# Patient Record
Sex: Female | Born: 1960 | Race: White | Hispanic: No | Marital: Married | Smoking: Never smoker
Health system: Southern US, Community
[De-identification: ages and names within clinical notes are randomized; demographics above are authoritative.]

## PROBLEM LIST (undated history)

## (undated) DIAGNOSIS — F418 Other specified anxiety disorders: Secondary | ICD-10-CM

## (undated) DIAGNOSIS — K219 Gastro-esophageal reflux disease without esophagitis: Secondary | ICD-10-CM

## (undated) DIAGNOSIS — G809 Cerebral palsy, unspecified: Secondary | ICD-10-CM

## (undated) DIAGNOSIS — Z923 Personal history of irradiation: Secondary | ICD-10-CM

## (undated) DIAGNOSIS — R569 Unspecified convulsions: Secondary | ICD-10-CM

## (undated) DIAGNOSIS — E669 Obesity, unspecified: Secondary | ICD-10-CM

## (undated) DIAGNOSIS — G40209 Localization-related (focal) (partial) symptomatic epilepsy and epileptic syndromes with complex partial seizures, not intractable, without status epilepticus: Secondary | ICD-10-CM

## (undated) DIAGNOSIS — J45909 Unspecified asthma, uncomplicated: Secondary | ICD-10-CM

## (undated) DIAGNOSIS — C50919 Malignant neoplasm of unspecified site of unspecified female breast: Secondary | ICD-10-CM

## (undated) DIAGNOSIS — G4730 Sleep apnea, unspecified: Secondary | ICD-10-CM

## (undated) DIAGNOSIS — G43009 Migraine without aura, not intractable, without status migrainosus: Secondary | ICD-10-CM

## (undated) DIAGNOSIS — Z9221 Personal history of antineoplastic chemotherapy: Secondary | ICD-10-CM

## (undated) DIAGNOSIS — M1990 Unspecified osteoarthritis, unspecified site: Secondary | ICD-10-CM

## (undated) HISTORY — DX: Malignant neoplasm of unspecified site of unspecified female breast: C50.919

## (undated) HISTORY — DX: Other specified anxiety disorders: F41.8

## (undated) HISTORY — DX: Obesity, unspecified: E66.9

## (undated) HISTORY — DX: Unspecified asthma, uncomplicated: J45.909

## (undated) HISTORY — PX: HERNIA REPAIR: SHX51

## (undated) HISTORY — PX: FOOT CAPSULE RELEASE W/ PERCUTANEOUS HEEL CORD LENGTHENING, TIBIAL TENDON TRANSFER: SHX1658

## (undated) HISTORY — PX: OTHER SURGICAL HISTORY: SHX169

## (undated) HISTORY — DX: Migraine without aura, not intractable, without status migrainosus: G43.009

## (undated) HISTORY — PX: OOPHORECTOMY: SHX86

## (undated) HISTORY — DX: Sleep apnea, unspecified: G47.30

## (undated) HISTORY — DX: Unspecified osteoarthritis, unspecified site: M19.90

## (undated) HISTORY — DX: Gastro-esophageal reflux disease without esophagitis: K21.9

## (undated) HISTORY — DX: Cerebral palsy, unspecified: G80.9

## (undated) HISTORY — DX: Localization-related (focal) (partial) symptomatic epilepsy and epileptic syndromes with complex partial seizures, not intractable, without status epilepticus: G40.209

---

## 1996-11-20 HISTORY — PX: BREAST LUMPECTOMY: SHX2

## 1997-12-24 ENCOUNTER — Ambulatory Visit (HOSPITAL_COMMUNITY): Admission: RE | Admit: 1997-12-24 | Discharge: 1997-12-24 | Payer: Self-pay

## 1998-07-08 ENCOUNTER — Encounter: Admission: RE | Admit: 1998-07-08 | Discharge: 1998-10-06 | Payer: Self-pay | Admitting: Family Medicine

## 1998-11-29 ENCOUNTER — Other Ambulatory Visit: Admission: RE | Admit: 1998-11-29 | Discharge: 1998-11-29 | Payer: Self-pay | Admitting: Obstetrics and Gynecology

## 1999-01-07 ENCOUNTER — Ambulatory Visit (HOSPITAL_BASED_OUTPATIENT_CLINIC_OR_DEPARTMENT_OTHER): Admission: RE | Admit: 1999-01-07 | Discharge: 1999-01-07 | Payer: Self-pay | Admitting: General Surgery

## 1999-01-31 ENCOUNTER — Ambulatory Visit (HOSPITAL_COMMUNITY): Admission: RE | Admit: 1999-01-31 | Discharge: 1999-01-31 | Payer: Self-pay | Admitting: Oncology

## 1999-02-01 ENCOUNTER — Ambulatory Visit (HOSPITAL_COMMUNITY): Admission: RE | Admit: 1999-02-01 | Discharge: 1999-02-02 | Payer: Self-pay | Admitting: General Surgery

## 1999-02-01 ENCOUNTER — Encounter: Payer: Self-pay | Admitting: General Surgery

## 1999-02-11 ENCOUNTER — Ambulatory Visit (HOSPITAL_COMMUNITY): Admission: RE | Admit: 1999-02-11 | Discharge: 1999-02-11 | Payer: Self-pay | Admitting: Oncology

## 1999-02-11 ENCOUNTER — Encounter: Payer: Self-pay | Admitting: Oncology

## 1999-03-01 ENCOUNTER — Encounter: Admission: RE | Admit: 1999-03-01 | Discharge: 1999-05-30 | Payer: Self-pay | Admitting: *Deleted

## 1999-05-31 ENCOUNTER — Encounter: Admission: RE | Admit: 1999-05-31 | Discharge: 1999-08-29 | Payer: Self-pay | Admitting: *Deleted

## 1999-09-23 ENCOUNTER — Emergency Department (HOSPITAL_COMMUNITY): Admission: EM | Admit: 1999-09-23 | Discharge: 1999-09-23 | Payer: Self-pay | Admitting: Emergency Medicine

## 1999-09-23 ENCOUNTER — Encounter: Payer: Self-pay | Admitting: Emergency Medicine

## 2000-06-01 ENCOUNTER — Other Ambulatory Visit: Admission: RE | Admit: 2000-06-01 | Discharge: 2000-06-01 | Payer: Self-pay | Admitting: Obstetrics and Gynecology

## 2000-07-18 ENCOUNTER — Encounter: Payer: Self-pay | Admitting: Emergency Medicine

## 2000-07-18 ENCOUNTER — Emergency Department (HOSPITAL_COMMUNITY): Admission: EM | Admit: 2000-07-18 | Discharge: 2000-07-18 | Payer: Self-pay | Admitting: Emergency Medicine

## 2000-08-28 ENCOUNTER — Ambulatory Visit (HOSPITAL_BASED_OUTPATIENT_CLINIC_OR_DEPARTMENT_OTHER): Admission: RE | Admit: 2000-08-28 | Discharge: 2000-08-28 | Payer: Self-pay | Admitting: General Surgery

## 2001-07-03 ENCOUNTER — Other Ambulatory Visit: Admission: RE | Admit: 2001-07-03 | Discharge: 2001-07-03 | Payer: Self-pay | Admitting: Obstetrics and Gynecology

## 2003-03-12 ENCOUNTER — Encounter: Admission: RE | Admit: 2003-03-12 | Discharge: 2003-03-12 | Payer: Self-pay | Admitting: Oncology

## 2003-03-12 ENCOUNTER — Encounter: Payer: Self-pay | Admitting: Oncology

## 2003-11-04 ENCOUNTER — Other Ambulatory Visit: Admission: RE | Admit: 2003-11-04 | Discharge: 2003-11-04 | Payer: Self-pay | Admitting: Obstetrics and Gynecology

## 2003-11-30 ENCOUNTER — Encounter: Admission: RE | Admit: 2003-11-30 | Discharge: 2004-02-28 | Payer: Self-pay | Admitting: Obstetrics and Gynecology

## 2006-11-20 HISTORY — PX: BREAST LUMPECTOMY: SHX2

## 2007-01-18 ENCOUNTER — Ambulatory Visit (HOSPITAL_COMMUNITY): Admission: RE | Admit: 2007-01-18 | Discharge: 2007-01-18 | Payer: Self-pay | Admitting: Family Medicine

## 2007-01-28 ENCOUNTER — Encounter (INDEPENDENT_AMBULATORY_CARE_PROVIDER_SITE_OTHER): Payer: Self-pay | Admitting: Specialist

## 2007-01-28 ENCOUNTER — Encounter: Admission: RE | Admit: 2007-01-28 | Discharge: 2007-01-28 | Payer: Self-pay | Admitting: Family Medicine

## 2007-01-29 ENCOUNTER — Encounter: Admission: RE | Admit: 2007-01-29 | Discharge: 2007-04-29 | Payer: Self-pay | Admitting: Family Medicine

## 2007-02-01 ENCOUNTER — Encounter: Admission: RE | Admit: 2007-02-01 | Discharge: 2007-02-01 | Payer: Self-pay | Admitting: Family Medicine

## 2007-02-19 ENCOUNTER — Encounter: Admission: RE | Admit: 2007-02-19 | Discharge: 2007-02-19 | Payer: Self-pay | Admitting: General Surgery

## 2007-02-21 ENCOUNTER — Ambulatory Visit (HOSPITAL_BASED_OUTPATIENT_CLINIC_OR_DEPARTMENT_OTHER): Admission: RE | Admit: 2007-02-21 | Discharge: 2007-02-21 | Payer: Self-pay | Admitting: General Surgery

## 2007-02-21 ENCOUNTER — Encounter (INDEPENDENT_AMBULATORY_CARE_PROVIDER_SITE_OTHER): Payer: Self-pay | Admitting: Specialist

## 2007-02-21 ENCOUNTER — Encounter: Admission: RE | Admit: 2007-02-21 | Discharge: 2007-02-21 | Payer: Self-pay | Admitting: General Surgery

## 2007-03-05 ENCOUNTER — Ambulatory Visit: Payer: Self-pay | Admitting: Oncology

## 2007-03-21 LAB — COMPREHENSIVE METABOLIC PANEL
Albumin: 4.2 g/dL (ref 3.5–5.2)
Alkaline Phosphatase: 53 U/L (ref 39–117)
Calcium: 8.7 mg/dL (ref 8.4–10.5)
Chloride: 108 mEq/L (ref 96–112)
Glucose, Bld: 105 mg/dL — ABNORMAL HIGH (ref 70–99)
Potassium: 3.6 mEq/L (ref 3.5–5.3)
Sodium: 139 mEq/L (ref 135–145)
Total Protein: 6.7 g/dL (ref 6.0–8.3)

## 2007-03-21 LAB — CBC WITH DIFFERENTIAL/PLATELET
Basophils Absolute: 0 10*3/uL (ref 0.0–0.1)
Eosinophils Absolute: 0 10*3/uL (ref 0.0–0.5)
HGB: 13.3 g/dL (ref 11.6–15.9)
MCV: 88.9 fL (ref 81.0–101.0)
MONO#: 0.5 10*3/uL (ref 0.1–0.9)
MONO%: 7 % (ref 0.0–13.0)
NEUT#: 4.4 10*3/uL (ref 1.5–6.5)
RBC: 4.23 10*6/uL (ref 3.70–5.32)
RDW: 12.9 % (ref 11.3–14.5)
WBC: 6.8 10*3/uL (ref 3.9–10.0)
lymph#: 1.9 10*3/uL (ref 0.9–3.3)

## 2007-03-21 LAB — FOLLICLE STIMULATING HORMONE: FSH: 23.4 m[IU]/mL

## 2007-03-25 ENCOUNTER — Ambulatory Visit: Admission: RE | Admit: 2007-03-25 | Discharge: 2007-06-12 | Payer: Self-pay | Admitting: *Deleted

## 2007-04-01 ENCOUNTER — Encounter: Admission: RE | Admit: 2007-04-01 | Discharge: 2007-04-01 | Payer: Self-pay | Admitting: Oncology

## 2007-04-25 ENCOUNTER — Encounter (INDEPENDENT_AMBULATORY_CARE_PROVIDER_SITE_OTHER): Payer: Self-pay | Admitting: Obstetrics and Gynecology

## 2007-04-25 ENCOUNTER — Ambulatory Visit (HOSPITAL_COMMUNITY): Admission: RE | Admit: 2007-04-25 | Discharge: 2007-04-25 | Payer: Self-pay | Admitting: Obstetrics and Gynecology

## 2007-05-23 ENCOUNTER — Encounter: Admission: RE | Admit: 2007-05-23 | Discharge: 2007-05-23 | Payer: Self-pay | Admitting: Oncology

## 2007-05-30 ENCOUNTER — Ambulatory Visit: Payer: Self-pay | Admitting: Oncology

## 2007-06-03 LAB — CBC WITH DIFFERENTIAL/PLATELET
Basophils Absolute: 0 10*3/uL (ref 0.0–0.1)
EOS%: 0 % (ref 0.0–7.0)
HCT: 37.3 % (ref 34.8–46.6)
HGB: 13.4 g/dL (ref 11.6–15.9)
MCH: 32.2 pg (ref 26.0–34.0)
MCHC: 35.8 g/dL (ref 32.0–36.0)
MCV: 89.9 fL (ref 81.0–101.0)
MONO%: 11.2 % (ref 0.0–13.0)
NEUT%: 66.9 % (ref 39.6–76.8)
RDW: 13.4 % (ref 11.3–14.5)

## 2007-06-06 LAB — COMPREHENSIVE METABOLIC PANEL
ALT: 17 U/L (ref 0–35)
AST: 17 U/L (ref 0–37)
CO2: 25 mEq/L (ref 19–32)
Chloride: 103 mEq/L (ref 96–112)
Sodium: 139 mEq/L (ref 135–145)
Total Bilirubin: 0.3 mg/dL (ref 0.3–1.2)
Total Protein: 7.2 g/dL (ref 6.0–8.3)

## 2007-06-06 LAB — CANCER ANTIGEN 27.29: CA 27.29: 18 U/mL (ref 0–39)

## 2007-06-13 LAB — ESTRADIOL, ULTRA SENS: Estradiol, Ultra Sensitive: 11 pg/mL

## 2007-08-02 ENCOUNTER — Ambulatory Visit: Payer: Self-pay | Admitting: Oncology

## 2007-08-29 LAB — CBC WITH DIFFERENTIAL/PLATELET
Basophils Absolute: 0 10*3/uL (ref 0.0–0.1)
Eosinophils Absolute: 0 10*3/uL (ref 0.0–0.5)
HCT: 35.6 % (ref 34.8–46.6)
HGB: 12.7 g/dL (ref 11.6–15.9)
MCV: 89.5 fL (ref 81.0–101.0)
MONO%: 10.3 % (ref 0.0–13.0)
NEUT#: 3.2 10*3/uL (ref 1.5–6.5)
RDW: 13.1 % (ref 11.3–14.5)

## 2007-08-29 LAB — COMPREHENSIVE METABOLIC PANEL
Albumin: 4.1 g/dL (ref 3.5–5.2)
Alkaline Phosphatase: 62 U/L (ref 39–117)
BUN: 6 mg/dL (ref 6–23)
Calcium: 9.2 mg/dL (ref 8.4–10.5)
Chloride: 104 mEq/L (ref 96–112)
Glucose, Bld: 105 mg/dL — ABNORMAL HIGH (ref 70–99)
Potassium: 3.5 mEq/L (ref 3.5–5.3)

## 2007-10-02 ENCOUNTER — Ambulatory Visit: Payer: Self-pay | Admitting: Oncology

## 2007-12-04 ENCOUNTER — Encounter: Admission: RE | Admit: 2007-12-04 | Discharge: 2008-01-01 | Payer: Self-pay | Admitting: Family Medicine

## 2008-01-14 ENCOUNTER — Encounter: Admission: RE | Admit: 2008-01-14 | Discharge: 2008-02-25 | Payer: Self-pay | Admitting: Family Medicine

## 2008-02-26 ENCOUNTER — Encounter: Admission: RE | Admit: 2008-02-26 | Discharge: 2008-02-26 | Payer: Self-pay | Admitting: Family Medicine

## 2008-04-16 ENCOUNTER — Ambulatory Visit: Payer: Self-pay | Admitting: Oncology

## 2008-04-20 LAB — CBC WITH DIFFERENTIAL/PLATELET
BASO%: 0.4 % (ref 0.0–2.0)
Basophils Absolute: 0 10*3/uL (ref 0.0–0.1)
HCT: 39.1 % (ref 34.8–46.6)
HGB: 13.5 g/dL (ref 11.6–15.9)
MCH: 31.4 pg (ref 26.0–34.0)
MCHC: 34.6 g/dL (ref 32.0–36.0)
MCV: 90.9 fL (ref 81.0–101.0)
MONO%: 8.2 % (ref 0.0–13.0)
NEUT%: 66.6 % (ref 39.6–76.8)
Platelets: 260 10*3/uL (ref 145–400)
RBC: 4.3 10*6/uL (ref 3.70–5.32)
RDW: 13.7 % (ref 11.3–14.5)

## 2008-04-20 LAB — COMPREHENSIVE METABOLIC PANEL
AST: 22 U/L (ref 0–37)
CO2: 24 mEq/L (ref 19–32)
Glucose, Bld: 110 mg/dL — ABNORMAL HIGH (ref 70–99)
Potassium: 3.7 mEq/L (ref 3.5–5.3)

## 2008-04-24 ENCOUNTER — Encounter: Admission: RE | Admit: 2008-04-24 | Discharge: 2008-04-24 | Payer: Self-pay | Admitting: Oncology

## 2008-10-16 ENCOUNTER — Ambulatory Visit: Payer: Self-pay | Admitting: Oncology

## 2009-02-26 ENCOUNTER — Ambulatory Visit: Payer: Self-pay | Admitting: Oncology

## 2009-03-02 LAB — COMPREHENSIVE METABOLIC PANEL
ALT: 18 U/L (ref 0–35)
AST: 23 U/L (ref 0–37)
Alkaline Phosphatase: 62 U/L (ref 39–117)
Chloride: 105 mEq/L (ref 96–112)
Glucose, Bld: 95 mg/dL (ref 70–99)
Potassium: 3.6 mEq/L (ref 3.5–5.3)
Sodium: 139 mEq/L (ref 135–145)
Total Protein: 6.6 g/dL (ref 6.0–8.3)

## 2009-03-02 LAB — CBC WITH DIFFERENTIAL/PLATELET
HGB: 12.8 g/dL (ref 11.6–15.9)
LYMPH%: 35.7 % (ref 14.0–49.7)
MCH: 31.3 pg (ref 25.1–34.0)
MONO%: 10.1 % (ref 0.0–14.0)
NEUT%: 53.9 % (ref 38.4–76.8)

## 2009-04-26 ENCOUNTER — Encounter: Admission: RE | Admit: 2009-04-26 | Discharge: 2009-04-26 | Payer: Self-pay | Admitting: Oncology

## 2009-05-25 ENCOUNTER — Encounter: Admission: RE | Admit: 2009-05-25 | Discharge: 2009-05-25 | Payer: Self-pay | Admitting: Oncology

## 2009-08-27 ENCOUNTER — Ambulatory Visit: Payer: Self-pay | Admitting: Oncology

## 2009-08-31 LAB — CBC WITH DIFFERENTIAL/PLATELET
BASO%: 0.3 % (ref 0.0–2.0)
Eosinophils Absolute: 0 10*3/uL (ref 0.0–0.5)
HCT: 37.8 % (ref 34.8–46.6)
LYMPH%: 33.6 % (ref 14.0–49.7)
MCV: 91.6 fL (ref 79.5–101.0)
MONO#: 0.4 10*3/uL (ref 0.1–0.9)
NEUT#: 3.3 10*3/uL (ref 1.5–6.5)
NEUT%: 58.6 % (ref 38.4–76.8)
Platelets: 281 10*3/uL (ref 145–400)
RBC: 4.13 10*6/uL (ref 3.70–5.45)

## 2009-08-31 LAB — COMPREHENSIVE METABOLIC PANEL
ALT: 22 U/L (ref 0–35)
AST: 22 U/L (ref 0–37)
BUN: 6 mg/dL (ref 6–23)
CO2: 29 mEq/L (ref 19–32)
Chloride: 105 mEq/L (ref 96–112)
Total Protein: 6.7 g/dL (ref 6.0–8.3)

## 2010-03-01 ENCOUNTER — Ambulatory Visit: Payer: Self-pay | Admitting: Oncology

## 2010-03-03 LAB — CBC WITH DIFFERENTIAL/PLATELET
BASO%: 0.2 % (ref 0.0–2.0)
EOS%: 0 % (ref 0.0–7.0)
Eosinophils Absolute: 0 10*3/uL (ref 0.0–0.5)
HCT: 39.7 % (ref 34.8–46.6)
HGB: 13.3 g/dL (ref 11.6–15.9)
MCH: 31.1 pg (ref 25.1–34.0)
MCV: 92.8 fL (ref 79.5–101.0)
MONO#: 0.6 10*3/uL (ref 0.1–0.9)
NEUT#: 3.3 10*3/uL (ref 1.5–6.5)
Platelets: 279 10*3/uL (ref 145–400)
RBC: 4.27 10*6/uL (ref 3.70–5.45)
RDW: 13.7 % (ref 11.2–14.5)
WBC: 5.7 10*3/uL (ref 3.9–10.3)
lymph#: 1.8 10*3/uL (ref 0.9–3.3)

## 2010-03-03 LAB — COMPREHENSIVE METABOLIC PANEL
AST: 22 U/L (ref 0–37)
Albumin: 3.9 g/dL (ref 3.5–5.2)
Alkaline Phosphatase: 56 U/L (ref 39–117)
BUN: 11 mg/dL (ref 6–23)
Creatinine, Ser: 0.65 mg/dL (ref 0.40–1.20)
Potassium: 3.8 mEq/L (ref 3.5–5.3)
Total Bilirubin: 0.5 mg/dL (ref 0.3–1.2)
Total Protein: 6.8 g/dL (ref 6.0–8.3)

## 2010-06-27 ENCOUNTER — Encounter: Admission: RE | Admit: 2010-06-27 | Discharge: 2010-06-27 | Payer: Self-pay | Admitting: Family Medicine

## 2010-08-26 ENCOUNTER — Ambulatory Visit: Payer: Self-pay | Admitting: Oncology

## 2010-08-30 LAB — CBC WITH DIFFERENTIAL/PLATELET
BASO%: 0.3 % (ref 0.0–2.0)
Eosinophils Absolute: 0 10*3/uL (ref 0.0–0.5)
HCT: 38.8 % (ref 34.8–46.6)
HGB: 13.4 g/dL (ref 11.6–15.9)
Platelets: 264 10*3/uL (ref 145–400)
RDW: 13.3 % (ref 11.2–14.5)

## 2010-08-30 LAB — COMPREHENSIVE METABOLIC PANEL
AST: 17 U/L (ref 0–37)
Alkaline Phosphatase: 62 U/L (ref 39–117)
BUN: 13 mg/dL (ref 6–23)
Creatinine, Ser: 0.63 mg/dL (ref 0.40–1.20)
Total Protein: 6.7 g/dL (ref 6.0–8.3)

## 2010-12-10 ENCOUNTER — Other Ambulatory Visit: Payer: Self-pay | Admitting: Oncology

## 2010-12-10 DIAGNOSIS — Z9889 Other specified postprocedural states: Secondary | ICD-10-CM

## 2010-12-10 DIAGNOSIS — Z90710 Acquired absence of both cervix and uterus: Secondary | ICD-10-CM

## 2010-12-10 DIAGNOSIS — Z853 Personal history of malignant neoplasm of breast: Secondary | ICD-10-CM

## 2011-03-08 ENCOUNTER — Encounter (HOSPITAL_BASED_OUTPATIENT_CLINIC_OR_DEPARTMENT_OTHER): Payer: BC Managed Care – PPO | Admitting: Oncology

## 2011-03-08 ENCOUNTER — Other Ambulatory Visit: Payer: Self-pay | Admitting: Oncology

## 2011-03-08 DIAGNOSIS — C50419 Malignant neoplasm of upper-outer quadrant of unspecified female breast: Secondary | ICD-10-CM

## 2011-03-08 DIAGNOSIS — F411 Generalized anxiety disorder: Secondary | ICD-10-CM

## 2011-03-08 DIAGNOSIS — F329 Major depressive disorder, single episode, unspecified: Secondary | ICD-10-CM

## 2011-03-08 LAB — CBC WITH DIFFERENTIAL/PLATELET
EOS%: 0 % (ref 0.0–7.0)
HCT: 37.7 % (ref 34.8–46.6)
MCHC: 34.4 g/dL (ref 31.5–36.0)
MCV: 91.4 fL (ref 79.5–101.0)
NEUT#: 3.8 10*3/uL (ref 1.5–6.5)
NEUT%: 59.7 % (ref 38.4–76.8)
RBC: 4.12 10*6/uL (ref 3.70–5.45)
RDW: 13.4 % (ref 11.2–14.5)
WBC: 6.4 10*3/uL (ref 3.9–10.3)

## 2011-03-08 LAB — COMPREHENSIVE METABOLIC PANEL
AST: 24 U/L (ref 0–37)
Albumin: 3.7 g/dL (ref 3.5–5.2)
BUN: 7 mg/dL (ref 6–23)
Calcium: 9 mg/dL (ref 8.4–10.5)
Creatinine, Ser: 0.5 mg/dL (ref 0.40–1.20)
Potassium: 3.7 mEq/L (ref 3.5–5.3)
Sodium: 139 mEq/L (ref 135–145)
Total Bilirubin: 0.7 mg/dL (ref 0.3–1.2)
Total Protein: 6.7 g/dL (ref 6.0–8.3)

## 2011-04-04 NOTE — H&P (Signed)
NAMEVINCENT, EHRLER               ACCOUNT NO.:  [id number]   MEDICAL RECORD NO.:  [id number]          PATIENT TYPE:  AMB   LOCATION:  SDC                           [facility]  [facility]   PHYSICIAN:  Duke Salvia. Marcelle Overlie, M.D.DATE OF BIRTH:  [birth year]   DATE OF ADMISSION:  04/25/2007  DATE OF DISCHARGE:                              HISTORY & PHYSICAL   CHIEF COMPLAINT:  Breast cancer.   HISTORY OF PRESENT ILLNESS:  A 50 year old, gravida 0, para 0.  I saw  her initially in 2004 at which point she was postop treatment for early  stage breast cancer and was on tamoxifen at that point.   Recently Dr. Darnelle Catalan is treating her for recurrent breast cancer and  had recommended BSO to remove her estrogen source to improve her  treatment outcome.  She presents now for laparoscopic BSO.  This  procedure including risk of bleeding, infection, adjacent organ injury,  the possible need for open additional surgery all were reviewed with her  which she understands and accepts.   CURRENT MEDICATIONS:  Tegretol, Tranxene, Singulair, Nexium,  multivitamins, Mobic p.r.n., Maxalt p.r.n., Desyrel p.r.n., and Allegra  p.r.n.   ALLERGIES:  SULFA DRUGS.   REVIEW OF SYSTEMS:  Significant for breast cancer.  In her younger life,  she was treated for a seizure disorder.   PHYSICAL EXAMINATION:  VITAL SIGNS:  Temperature 98.2, blood pressure  120/70.  HEENT:  Unremarkable.  NECK:  Supple without masses.  LUNGS:  Clear.  CARDIOVASCULAR:  Regular rate and rhythm without murmurs, rubs or  gallops.  BREASTS:  No masses.  ABDOMEN:  Soft, flat and nontender.  PELVIC:  Normal external genitalia and vagina.  Vagina, cervix clear.  Uterus normal position and size.  Adnexa unremarkable.  EXTREMITIES/NEUROLOGIC:  Unremarkable.   IMPRESSION:  Recurrent ER positive breast cancer.  Dr. Darnelle Catalan has  recommended bilateral salpingo-oophorectomy to remove her estrogen  source.   PLAN:  Laparoscopic bilateral  salpingo-oophorectomy.  Procedure and  risks reviewed with as above.     Richard M. Marcelle Overlie, M.D.  Electronically Signed    RMH/MEDQ  D:  04/23/2007  T:  04/23/2007  Job:  [id number]

## 2011-04-04 NOTE — Op Note (Signed)
[name], Debbie Peck               ACCOUNT NO.:  [id number]   MEDICAL RECORD NO.:  [id number]          PATIENT TYPE:  AMB   LOCATION:  SDC                           [facility]   PHYSICIAN:  Duke Salvia. Marcelle Overlie, M.D.DATE OF BIRTH:  22-Jan-1961   DATE OF PROCEDURE:  04/25/2007  DATE OF DISCHARGE:                               OPERATIVE REPORT   PREOPERATIVE DIAGNOSIS:  Recurrent breast cancer estrogen receptor  positive.   POSTOPERATIVE DIAGNOSIS:  Recurrent breast cancer estrogen receptor  positive.   PROCEDURE:  Laparoscopic BSO.   SURGEON:  Dr. Marcelle Overlie   ANESTHESIA:  General endotracheal.   SPECIMENS REMOVED:  Bilateral tubes and ovaries.   DESCRIPTION OF PROCEDURE:  The patient was taken to the operating room.  After adequate level of general diagnoses obtained the patient's legs  stirrups.  The abdomen, perineum and vagina prepped and usual manner for  laparoscopic procedures.  Bladder was drained.  Hulka tenaculum was  positioned.  Attention directed to the abdomen where the subumbilical  area was infiltrated with 0.5% Marcaine plain.  A small incision was  made. A Veress needle was introduced without difficulty.  Its intra-  abdominal position verified by pressure water testing.  After 2.5 liter  pneumoperitoneum was then created, laparoscopic trocar and sleeve were  then introduced without difficulty.  Three fingerbreadths above the  symphysis in midline a bladeless 11/12 trocar inserted in the midline  and a 5 mm in the left lower quadrant after negative transillumination.  The patient placed in Trendelenburg. Pelvic findings as follows.   The uterus, tubes and ovaries, cul-de-sac unremarkable.  The cecum,  upper abdomen and appendix were unremarkable.  Uterus was anteflexed.  The right tube and ovary was grasped with a atraumatic grasper placed on  traction toward the midline.  The course of the right ureter was well  below.  The right IP ligament was then  coagulated and divided with the  gyrus PK instrument along the mesosalpinx dividing and removing the  right tube and ovary with excellent hemostasis.  The exact same repeated  on the opposite side after carefully identifying the course of the left  ureter below.  Specimens were then cut in half and removed through the  lower port and sent to pathology. The Nezhat irrigator was then used to  irrigate and all fragments of ovarian and tubal tissue had been removed.  Hemostasis was excellent even with reduced pressure.  Instruments were  removed, gas allowed to escape, defects closed with 4-0 Dexon  subcuticular sutures and Dermabond.  She tolerated this well and went to  recovery room in good condition.     Richard M. Marcelle Overlie, M.D.  Electronically Signed    RMH/MEDQ  D:  04/25/2007  T:  04/25/2007  Job:  [id number]

## 2011-04-07 NOTE — Op Note (Signed)
[name], [name]               ACCOUNT NO.:  [id number]   MEDICAL RECORD NO.:  [id number]          PATIENT TYPE:  AMB   LOCATION:  DSC                          FACILITY:  [facility]   PHYSICIAN:  Anselm Pancoast. 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   POSTOPERATIVE DIAGNOSIS:  Await final pathology.   OPERATION:  Needle localized excisional biopsy and sentinel node in left  axilla.   ANESTHESIA:  General.   SURGEON:  Anselm Pancoast. Zachery Dakins, M.D.   HISTORY:  Debbie Peck is a 50 year old female who I managed about 7  years ago, I think, with a large mass in the right breast that was  carcinoma.  She had a sentinel lymph node.  She was with chemotherapy,  had a reexcision because of positive margin after the first lumpectomy  and has had radiation therapy.  She is being followed with yearly  mammograms and no change in the right, but we had an area of  microcalcification in the left breast at probably the 3 to 4 o'clock  position that was biopsied in the breast center and shows carcinoma.  The area of microcalcification is probably about the size of a nickel to  a quarter and on the mammogram, there really is no definite mass effect  so hopefully this is quite early.  When I saw her in office about 2  weeks ago, she was right after a biopsy and you could feel an area of  bruising, but there was not an actual mass palpable and we elected to go  ahead and do an excisional biopsy after needle localization and sentinel  node today.  I do not think she has seen Dr. Raymond Gurney C. Magrinat and  whether this will need to be just radiation therapy or combination chemo  radiation afterwards, we will determine after we have had the results of  all of the pathology specimens.  This morning, first, she has had a  needle localization.  She has also had injection of the radioactive dye  in the left subareolar area and then was sent over to the [facility].  The patient is quite anxious as she frequently is and was  given a little Ativan in the preop holding area.  The patient's left  side was marked and on examination now, you can see the wire but there  is really not a mass.  When I saw her in the office, what I was feeling  I think was the biopsy hematoma and not actually a breast mass.  There  are no nodes palpable in the axilla.  She was given a gram of Ancef and  positioned on the OR table.  Induction of general anesthesia with an  tube and I used the needle probe and actually marked the skin where the  hot areas were.  I then injected the subcuticular areolar area with  about 3 mL of  the Lymphazurin and then massaged the area.  I had prepped  her prior to injecting of the Lymphazurin.  The area first, I elected to  go ahead and do the axillary lymph node biopsy.  I made a little  incision right where the hot area was with the probe transversely and  then dissected down through the adipose tissue.  I picked up the  lymphatic an then followed this into an area that certainly felt and  looked to me like a lymph node and used a few little Hemoclips and also  some sutures of 4-0 Vicryl in the surrounding area.  The tissue had a  count of about 50, which was initially higher than the surrounding  adipose tissue and Dr. Luisa Hart did touch preps on this as I was doing  the lumpectomy incision.  As far as on the touch preps, when he called,  he said there was definitely blue.  What felt like a lymph node and was  definitely blue was adipose tissue and there was not really a true lymph  node that they could definitely identify.  The touch preps and  everything were definitely negative.  There was a lot of blue dye.  I  discussed with him that I would open up the incision and get more  lymphoid  tissue after I had completed a lumpectomy.  As far as on the  lumpectomy, the incision was kind of lateral since this area is  definitely more lateral than usual and I elevated the skin from the  underlying adipose and breast tissue and then flipped the wire from  under the skin so we are getting a little more central to the true  breast tissue not coming in so lateral where they have put their guide  wire in, and then went circumferential on the area predominantly in  fatty tissue and then the actual breast tissue.  I could see the  inferior aspect of the wire and the breast tissue was divided  predominantly with cautery. There were several little areas of bleeders  that were either coagulated or sutured with a 4-0 Vicryl.  As far as on  removal of the area, I have sort of lost any type of orientation and the  breast is actually medial and inferior and then the wire of course is  going out on the specimen.  On the mammography specimen, it appears that  the microcalcification was definitely in the center of this and it looks  like we have a good margin circumferentially.  The defect was closed in  2 layers using 4-0 Vicryl in the deeper layer.  No drains were placed  and then used 4-0 Vicryl in the middle more superficial and then closed  the skin with staples.  I opened up the axilla where I closed it in the  fatty tissue but not actually closed the skin and then using the needle  probe found 3 lymph nodes all in the immediate area that were definitely  warm, some of them were counts of 80 or 90, others were 40 to 50 that  were not grossly abnormal but they are obviously lymph nodes.  One of  them was faintly blue but not as blue as what I would consider a hot and  definitely blue node.  Dr. Luisa Hart looked at these and said that these  are definitely lymph nodes, does not see anything that looks suspicious for metastatic disease and it looks like there is probably  3 lymph nodes  according  to his count.  We closed the defect with 4-0 Vicryl.  I had  used some 4-0 Vicryl sutures and also a few clips and removing these and  then closed the wound, the deeper layer, with 4-0 Vicryl and then a  superficial 4-0 Vicryl and then skin staples.  The patient tolerated the  procedure nicely, will be released after a short stay in the recovery  room.   I will see her back in the office next Thursday and hopefully all of the  lymph  nodes final report will be negative and that they will not have  extensive DCIS in the surrounding breast tissue.           ______________________________  Anselm Pancoast. Zachery Dakins, M.D.     WJW/MEDQ  D:  02/21/2007  T:  02/21/2007  Job:  [id number]   cc:   Valentino Hue. Magrinat, M.D.

## 2011-06-29 ENCOUNTER — Ambulatory Visit
Admission: RE | Admit: 2011-06-29 | Discharge: 2011-06-29 | Disposition: A | Discharge status: Home / Self Care | Payer: BC Managed Care – PPO | Source: Ambulatory Visit | Attending: Oncology | Admitting: Oncology

## 2011-06-29 DIAGNOSIS — Z90710 Acquired absence of both cervix and uterus: Secondary | ICD-10-CM

## 2011-06-29 DIAGNOSIS — Z9889 Other specified postprocedural states: Secondary | ICD-10-CM

## 2011-06-29 DIAGNOSIS — Z853 Personal history of malignant neoplasm of breast: Secondary | ICD-10-CM

## 2011-09-07 LAB — COMPREHENSIVE METABOLIC PANEL
ALT: 22
AST: 26
Albumin: 3.5
Alkaline Phosphatase: 49
BUN: 6
CO2: 25
Calcium: 9
Chloride: 106
Creatinine, Ser: 0.58
GFR calc Af Amer: >60
GFR calc non Af Amer: >60
Glucose, Bld: 100 — ABNORMAL HIGH
Potassium: 3.6
Sodium: 138
Total Bilirubin: 0.3
Total Protein: 6.2

## 2011-09-07 LAB — CBC
RBC: 4.07
WBC: 7.5

## 2012-04-22 ENCOUNTER — Encounter: Payer: Self-pay | Admitting: Oncology

## 2012-04-22 NOTE — Progress Notes (Signed)
Put disability placard form in registration desk.

## 2012-12-06 ENCOUNTER — Telehealth: Payer: Self-pay | Admitting: *Deleted

## 2012-12-06 NOTE — Telephone Encounter (Signed)
Pt. Called and left message that the instructor for the [employer] class had not received written permission for [place] to participate in the class.   VO given by Dr. Darnelle Catalan.  Spoke with Baxter Hire the instructor [phone].  Note written and faxed to her [phone].  Called pt. And left message that permission had been faxed to instructor.

## 2013-07-25 ENCOUNTER — Encounter: Payer: Self-pay | Admitting: Neurology

## 2013-07-25 DIAGNOSIS — G809 Cerebral palsy, unspecified: Secondary | ICD-10-CM | POA: Insufficient documentation

## 2013-07-25 DIAGNOSIS — Z5181 Encounter for therapeutic drug level monitoring: Secondary | ICD-10-CM | POA: Insufficient documentation

## 2013-07-25 DIAGNOSIS — G43009 Migraine without aura, not intractable, without status migrainosus: Secondary | ICD-10-CM | POA: Insufficient documentation

## 2013-07-25 DIAGNOSIS — G808 Other cerebral palsy: Secondary | ICD-10-CM

## 2013-07-25 DIAGNOSIS — G40219 Localization-related (focal) (partial) symptomatic epilepsy and epileptic syndromes with complex partial seizures, intractable, without status epilepticus: Secondary | ICD-10-CM | POA: Insufficient documentation

## 2013-07-30 ENCOUNTER — Ambulatory Visit (INDEPENDENT_AMBULATORY_CARE_PROVIDER_SITE_OTHER): Payer: Self-pay | Admitting: Neurology

## 2013-07-30 ENCOUNTER — Encounter: Payer: Self-pay | Admitting: Neurology

## 2013-07-30 VITALS — BP 124/76 | HR 72 | Wt 214.0 lb

## 2013-07-30 DIAGNOSIS — G40219 Localization-related (focal) (partial) symptomatic epilepsy and epileptic syndromes with complex partial seizures, intractable, without status epilepticus: Secondary | ICD-10-CM

## 2013-07-30 DIAGNOSIS — G809 Cerebral palsy, unspecified: Secondary | ICD-10-CM

## 2013-07-30 MED ORDER — CARBAMAZEPINE 200 MG PO TABS: 400.0000 mg | ORAL_TABLET | Freq: Two times a day (BID) | ORAL | Status: DC

## 2013-07-30 MED ORDER — RIZATRIPTAN BENZOATE 10 MG PO TABS: 10.0000 mg | ORAL_TABLET | ORAL | Status: DC | PRN

## 2013-07-30 NOTE — Patient Instructions (Signed)
With the carbamazepine, start at 200 mg twice a day for 2 weeks, then take 2 tablets twice a day.

## 2013-07-30 NOTE — Progress Notes (Signed)
Reason for visit: Seizures, headache  Debbie Peck is an 52 y.o. female  History of present illness:  Debbie Peck is a 52 year old left-handed white female with a history of seizures, cerebral palsy, migraine headache, and psychiatric disease. The patient indicates that she lost her insurance about 6 months ago, and she has been off of all of her medications for least 4 months. The patient has not had any recurrent seizures. The patient was on carbamazepine taking the long-acting 400 mg tablet twice daily. The patient does have occasional migraine headaches, and she usually takes Maxalt for this. The patient has had some increase in depression, and she is not sleeping well since she has been off of all of her medications. The patient returns for an evaluation.  Past Medical History  Diagnosis Date  . 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 Migraine without aura, without mention of intractable migraine without mention of status migrainosus   . Cerebral palsy   . Obesity   . Asthma   . Depression with anxiety   . Degenerative arthritis   . Breast cancer   . Gastroesophageal reflux disease     Past Surgical History  Procedure Laterality Date  . Breast lumpectomy    . Hernia repair    . Ovaries remove    . Foot capsule release w/ percutaneous heel cord lengthening, tibial tendon transfer Right   . Oophorectomy Bilateral     Family History  Problem Relation Age of Onset  . Seizures Mother   . Stroke Mother   . Hypertension Father   . Diabetes Father   . Obesity Sister     Social history:  reports that she has never smoked. She has never used smokeless tobacco. She reports that she does not drink alcohol or use illicit drugs.    Allergies  Allergen Reactions  . Sulfa Drugs Cross Reactors     Medications:  Current Outpatient Prescriptions on File Prior to Visit   Medication Sig Dispense Refill  . anastrozole (ARIMIDEX) 1 MG tablet Take 1 mg by mouth daily.      . clorazepate (TRANXENE) 7.5 MG tablet Take 7.5 mg by mouth 2 (two) times daily as needed.      Marland Kitchen esomeprazole (NEXIUM) 40 MG capsule Take 40 mg by mouth daily before breakfast.      . Fluticasone-Salmeterol (ADVAIR) 250-50 MCG/DOSE AEPB Inhale 1 puff into the lungs every 12 (twelve) hours.      Marland Kitchen levalbuterol (XOPENEX HFA) 45 MCG/ACT inhaler Inhale 1-2 puffs into the lungs every 4 (four) hours as needed.      . mometasone (NASONEX) 50 MCG/ACT nasal spray Place 2 sprays into the nose daily.      . montelukast (SINGULAIR) 10 MG tablet Take 10 mg by mouth at bedtime.      . sertraline (ZOLOFT) 100 MG tablet Take 100 mg by mouth daily.      Marland Kitchen tolterodine (DETROL LA) 4 MG 24 hr capsule Take 4 mg by mouth daily.      . traZODone (DESYREL) 100 MG tablet Take 100 mg by mouth at bedtime.       No current facility-administered medications on file prior to visit.    ROS:  Out of a complete 14 system review of symptoms, the patient complains only of the following symptoms, and all other reviewed systems are negative.  Weight gain Wheezing Insomnia  Blood pressure 124/76,  pulse 72, weight 214 lb (97.07 kg).  Physical Exam  General: The patient is alert and cooperative at the time of the examination. The patient is moderately obese  Skin: No significant peripheral edema is noted.   Neurologic Exam  Cranial nerves: Facial symmetry is present. Speech is normal, no aphasia or dysarthria is noted. Extraocular movements are full. Visual fields are full.  Motor: The patient has good strength in all 4 extremities.  Coordination: The patient has good finger-nose-finger and heel-to-shin bilaterally.  Gait and station: The patient has a normal gait. Tandem gait is slightly unsteady. Romberg is negative. No drift is seen.  Reflexes: Deep tendon reflexes are symmetric.   Assessment/Plan:  One.  History seizures  2. Cerebral palsy  3. Migraine headache  The patient will be placed back on generic carbamazepine taking 200 mg twice daily for 2 weeks, then going to 400 mg twice daily. The patient will followup in about 8 or 9 months. The patient was also given a prescription for the Maxalt. The patient will contact me if any further issues arise. The patient should be getting her health insurance back sometime in November of this year.  Marlan Palau MD 07/30/2013 7:04 PM  [facility] [address] [place], [place] [address]  Phone [phone] Fax [phone]

## 2014-01-16 ENCOUNTER — Emergency Department (INDEPENDENT_AMBULATORY_CARE_PROVIDER_SITE_OTHER): Payer: BC Managed Care – PPO

## 2014-01-16 ENCOUNTER — Emergency Department (HOSPITAL_COMMUNITY)
Admission: EM | Admit: 2014-01-16 | Discharge: 2014-01-16 | Disposition: A | Discharge status: Home / Self Care | Payer: BC Managed Care – PPO | Source: Home / Self Care | Attending: Emergency Medicine | Admitting: Emergency Medicine

## 2014-01-16 ENCOUNTER — Encounter (HOSPITAL_COMMUNITY): Payer: Self-pay | Admitting: Emergency Medicine

## 2014-01-16 DIAGNOSIS — S6992XA Unspecified injury of left wrist, hand and finger(s), initial encounter: Secondary | ICD-10-CM

## 2014-01-16 DIAGNOSIS — S59909A Unspecified injury of unspecified elbow, initial encounter: Secondary | ICD-10-CM

## 2014-01-16 DIAGNOSIS — S6990XA Unspecified injury of unspecified wrist, hand and finger(s), initial encounter: Secondary | ICD-10-CM

## 2014-01-16 DIAGNOSIS — S59919A Unspecified injury of unspecified forearm, initial encounter: Secondary | ICD-10-CM

## 2014-01-16 NOTE — Discharge Instructions (Signed)
As we discussed, you chould keep today's splint clean, dry and in place until you return here for repeat left wrist xrays in 5-7 days. Keep left wrist elevated as much as possible and apply ice to reduce swelling 3-4 times a day. Tylenol or ibuprofen as directed on packaging for pain.

## 2014-01-16 NOTE — ED Notes (Signed)
Notified ortho tech ?

## 2014-01-16 NOTE — ED Notes (Signed)
Fell yesterday inside her home.  Tripped over a blanket, falling forward on the ground, landing on forearms and palm side of hands.  Reports hearing a pop in wrist and noted false nail popped off with the force of landing.  Swelling and pain in left hand/wrist and forearm.

## 2014-01-16 NOTE — ED Provider Notes (Signed)
CSN: [id number]     Arrival date & time 01/16/14  1102 History   First MD Initiated Contact with Patient 01/16/14 1245     Chief Complaint  Patient presents with  . Wrist Pain   (Consider location/radiation/quality/duration/timing/severity/associated sxs/prior Treatment) HPI Comments: Tripped over her dog yesterday and fell onto left outstretched hand. Complains of persistent left hand and wrist pain with ecchymosis and swelling. No previous injuries or surgery.   Patient is a 53 y.o. female presenting with wrist pain. The history is provided by the patient.  Wrist Pain This is a new problem. The current episode started yesterday.    Past Medical History  Diagnosis Date  . 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 Migraine without aura, without mention of intractable migraine without mention of status migrainosus   . Cerebral palsy   . Obesity   . Asthma   . Depression with anxiety   . Degenerative arthritis   . Breast cancer   . Gastroesophageal reflux disease    Past Surgical History  Procedure Laterality Date  . Breast lumpectomy    . Hernia repair    . Ovaries remove    . Foot capsule release w/ percutaneous heel cord lengthening, tibial tendon transfer Right   . Oophorectomy Bilateral    Family History  Problem Relation Age of Onset  . Seizures Mother   . Stroke Mother   . Hypertension Father   . Diabetes Father   . Obesity Sister    History  Substance Use Topics  . Smoking status: Never Smoker   . Smokeless tobacco: Never Used  . Alcohol Use: No   OB History   Grav Para Term Preterm Abortions TAB SAB Ect Mult Living                 Review of Systems  All other systems reviewed and are negative.    Allergies  Sulfa drugs cross reactors  Home Medications   Current Outpatient Rx  Name  Route  Sig  Dispense  Refill  . anastrozole (ARIMIDEX) 1 MG  tablet   Oral   Take 1 mg by mouth daily.         . carbamazepine (TEGRETOL) 200 MG tablet   Oral   Take 2 tablets (400 mg total) by mouth 2 (two) times daily.   120 tablet   5   . clorazepate (TRANXENE) 7.5 MG tablet   Oral   Take 7.5 mg by mouth 2 (two) times daily as needed.         Marland Kitchen esomeprazole (NEXIUM) 40 MG capsule   Oral   Take 40 mg by mouth daily before breakfast.         . Fluticasone-Salmeterol (ADVAIR) 250-50 MCG/DOSE AEPB   Inhalation   Inhale 1 puff into the lungs every 12 (twelve) hours.         Marland Kitchen levalbuterol (XOPENEX HFA) 45 MCG/ACT inhaler   Inhalation   Inhale 1-2 puffs into the lungs every 4 (four) hours as needed.         . mometasone (NASONEX) 50 MCG/ACT nasal spray   Nasal   Place 2 sprays into the nose daily.         . montelukast (SINGULAIR) 10 MG tablet   Oral   Take 10 mg by mouth at bedtime.         . rizatriptan (MAXALT) 10 MG tablet  Oral   Take 1 tablet (10 mg total) by mouth as needed. May repeat in 2 hours if needed   10 tablet   3   . sertraline (ZOLOFT) 100 MG tablet   Oral   Take 100 mg by mouth daily.         Marland Kitchen tolterodine (DETROL LA) 4 MG 24 hr capsule   Oral   Take 4 mg by mouth daily.         . traZODone (DESYREL) 100 MG tablet   Oral   Take 100 mg by mouth at bedtime.          BP 129/73  Pulse 87  Temp(Src) 99.2 F (37.3 C) (Oral)  Resp 20  SpO2 98% Physical Exam  Nursing note and vitals reviewed. Constitutional: She is oriented to person, place, and time. She appears well-developed and well-nourished. No distress.  HENT:  Head: Normocephalic and atraumatic.  Eyes: Conjunctivae are normal.  Cardiovascular: Normal rate.   Pulmonary/Chest: Effort normal.  Musculoskeletal:       Left wrist: She exhibits decreased range of motion, tenderness, bony tenderness and swelling. She exhibits no effusion, no crepitus, no deformity and no laceration.  +left wrist snuff box tenderness. Increased  pain with palpation and supination. CSM exam intact at left hand  Neurological: She is alert and oriented to person, place, and time.  Skin: Skin is warm and dry.  Psychiatric: She has a normal mood and affect. Her behavior is normal.    ED Course  Procedures (including critical care time) Labs Review Labs Reviewed - No data to display Imaging Review Dg Wrist Complete Left  01/16/2014   CLINICAL DATA:  Status post fall with pain  EXAM: LEFT WRIST - COMPLETE 3+ VIEW  COMPARISON:  None.  FINDINGS: There is no evidence of fracture or dislocation. Small focal lucency in the scaphoid with narrow root the zone of transition, non aggressive appearing. Soft tissues are unremarkable.  IMPRESSION: No acute fracture or dislocation.   Electronically Signed   By: Abelardo Diesel M.D.   On: 01/16/2014 13:16   Dg Hand Complete Left  01/16/2014   CLINICAL DATA:  Fall.  Hand pain  EXAM: LEFT HAND - COMPLETE 3+ VIEW  COMPARISON:  11/07/2010  FINDINGS: There is no evidence of fracture or dislocation. There is no evidence of arthropathy or other focal bone abnormality. Soft tissues are unremarkable.  IMPRESSION: Negative.   Electronically Signed   By: Franchot Gallo M.D.   On: 01/16/2014 13:16     MDM   1. Left wrist injury    Left wrist [place] injury: discussed possibility of occult left wrist navicular injury given mechanism of injury and snuffbox tenderness on exam. Will immobilize in plaster thumb spica splint and advise return for repeat xrays in 5-7 days. If no bony callus formation to indicate occult navicular fracture, will convert to velcro splint for comfort. If films repeat films suggest occult fracture, will remain in plaster splint and refer to hand ortho for follow up.     [place], [place] 01/16/14 1346

## 2014-01-18 NOTE — ED Provider Notes (Signed)
Medical screening examination/treatment/procedure(s) were performed by a resident physician or non-physician practitioner and as the supervising physician I was immediately available for consultation/collaboration.  Lynne Leader, MD    Gregor Hams, MD 01/18/14 [phone]

## 2014-01-23 ENCOUNTER — Encounter (HOSPITAL_COMMUNITY): Payer: Self-pay | Admitting: Emergency Medicine

## 2014-01-23 ENCOUNTER — Emergency Department (INDEPENDENT_AMBULATORY_CARE_PROVIDER_SITE_OTHER): Payer: BC Managed Care – PPO

## 2014-01-23 ENCOUNTER — Emergency Department (INDEPENDENT_AMBULATORY_CARE_PROVIDER_SITE_OTHER)
Admission: EM | Admit: 2014-01-23 | Discharge: 2014-01-23 | Disposition: A | Discharge status: Home / Self Care | Payer: BC Managed Care – PPO | Source: Home / Self Care

## 2014-01-23 DIAGNOSIS — Z09 Encounter for follow-up examination after completed treatment for conditions other than malignant neoplasm: Secondary | ICD-10-CM

## 2014-01-23 DIAGNOSIS — S63502A Unspecified sprain of left wrist, initial encounter: Secondary | ICD-10-CM

## 2014-01-23 DIAGNOSIS — S63509A Unspecified sprain of unspecified wrist, initial encounter: Secondary | ICD-10-CM

## 2014-01-23 NOTE — Discharge Instructions (Signed)
Sprain °A sprain is a tear in one of the strong, fibrous tissues that connect your bones (ligaments). The severity of the sprain depends on how much of the ligament is torn. The tear can be either partial or complete. °CAUSES  °Often, sprains are a result of a fall or an injury. The force of the impact causes the fibers of your ligament to stretch beyond their normal length. This excess tension causes the fibers of your ligament to tear. °SYMPTOMS  °You may have some loss of motion or increased pain within your normal range of motion. Other symptoms include: °· Bruising. °· Tenderness. °· Swelling. °DIAGNOSIS  °In order to diagnose a sprain, your caregiver will physically examine you to determine how torn the ligament is. Your caregiver may also suggest an X-ray exam to make sure no bones are broken. °TREATMENT  °If your ligament is only partially torn, treatment usually involves keeping the injured area in a fixed position (immobilization) for a short period. To do this, your caregiver will apply a bandage, cast, or splint to keep the area from moving until it heals. For a partially torn ligament, the healing process usually takes 2 to 3 weeks. °If your ligament is completely torn, you may need surgery to reconnect the ligament to the bone or to reconstruct the ligament. After surgery, a cast or splint may be applied and will need to stay on for 4 to 6 weeks while your ligament heals. °HOME CARE INSTRUCTIONS °· Keep the injured area elevated to decrease swelling. °· To ease pain and swelling, apply ice to your joint twice a day, for 2 to 3 days. °· Put ice in a plastic bag. °· Place a towel between your skin and the bag. °· Leave the ice on for 15 minutes. °· Only take over-the-counter or prescription medicine for pain as directed by your caregiver. °· Do not leave the injured area unprotected until pain and stiffness go away (usually 3 to 4 weeks). °· Do not allow your cast or splint to get wet. Cover your cast or  splint with a plastic bag when you shower or bathe. Do not swim. °· Your caregiver may suggest exercises for you to do during your recovery to prevent or limit permanent stiffness. °SEEK IMMEDIATE MEDICAL CARE IF: °· Your cast or splint becomes damaged. °· Your pain becomes worse. °MAKE SURE YOU: °· Understand these instructions. °· Will watch your condition. °· Will get help right away if you are not doing well or get worse. °Document Released: 11/03/2000 Document Revised: 01/29/2012 Document Reviewed: 11/18/2011 °[employer]® Patient Information ©2014 [employer]. ° ° ° °Wrist Sprain °with Rehab °A sprain is an injury in which a ligament that maintains the proper alignment of a joint is partially or completely torn. The ligaments of the wrist are susceptible to sprains. Sprains are classified into three categories. Grade 1 sprains cause pain, but the tendon is not lengthened. Grade 2 sprains include a lengthened ligament because the ligament is stretched or partially ruptured. With grade 2 sprains there is still function, although the function may be diminished. Grade 3 sprains are characterized by a complete tear of the tendon or muscle, and function is usually impaired. °SYMPTOMS  °· Pain tenderness, inflammation, and/or bruising (contusion) of the injury. °· A "pop" or tear felt and/or heard at the time of injury. °· Decreased wrist function. °CAUSES  °A wrist sprain occurs when a force is placed on one or more ligaments that is greater than it/they can   withstand. Common mechanisms of injury include: °· Catching a ball with you hands. °· Repetitive and/ or strenuous extension or flexion of the wrist. °RISK INCREASES WITH: °· Previous wrist injury. °· Contact sports (boxing or wrestling). °· Activities in which falling is common. °· Poor strength and flexibility. °· Improperly fitted or padded protective equipment. °PREVENTION °· Warm up and stretch properly before activity. °· Allow for adequate recovery between  workouts. °· Maintain physical fitness: °· Strength, flexibility, and endurance. °· Cardiovascular fitness. °· Protect the wrist joint by limiting its motion with the use of taping, braces, or splints. °· Protect the wrist after injury for 6 to 12 months. °PROGNOSIS  °The prognosis for wrist sprains depends on the degree of injury. Grade 1 sprains require 2 to 6 weeks of treatment. Grade 2 sprains require 6 to 8 weeks of treatment, and grade 3 sprains require up to 12 weeks.  °RELATED COMPLICATIONS  °· Prolonged healing time, if improperly treated or re-injured. °· Recurrent symptoms that result in a chronic problem. °· Injury to nearby structures (bone, cartilage, nerves, or tendons). °· Arthritis of the wrist. °· Inability to compete in athletics at a high level. °· Wrist stiffness or weakness. °· Progression to a complete rupture of the ligament. °TREATMENT  °Treatment initially involves resting from any activities that aggravate the symptoms, and the use of ice and medications to help reduce pain and inflammation. Your caregiver may recommend immobilizing the wrist for a period of time in order to reduce stress on the ligament and allow for healing. After immobilization it is important to perform strengthening and stretching exercises to help regain strength and a full range of motion. These exercises may be completed at home or with a therapist. Surgery is not usually required for wrist sprains, unless the ligament has been ruptured (grade 3 sprain). °MEDICATION  °· If pain medication is necessary, then nonsteroidal anti-inflammatory medications, such as aspirin and ibuprofen, or other minor pain relievers, such as acetaminophen, are often recommended. °· Do not take pain medication for 7 days before surgery. °· Prescription pain relievers may be given if deemed necessary by your caregiver. Use only as directed and only as much as you need. °HEAT AND COLD °· Cold treatment (icing) relieves pain and reduces  inflammation. Cold treatment should be applied for 10 to 15 minutes every 2 to 3 hours for inflammation and pain and immediately after any activity that aggravates your symptoms. Use ice packs or massage the area with a piece of ice (ice massage). °· Heat treatment may be used prior to performing the stretching and strengthening activities prescribed by your caregiver, physical therapist, or athletic trainer. Use a heat pack or soak your injury in warm water. °SEEK MEDICAL CARE IF: °· Treatment seems to offer no benefit, or the condition worsens. °· Any medications produce adverse side effects. °EXERCISES °RANGE OF MOTION (ROM) AND STRETCHING EXERCISES - Wrist Sprain  °These exercises may help you when beginning to rehabilitate your injury. Your symptoms may resolve with or without further involvement from your physician, physical therapist or athletic trainer. While completing these exercises, remember:  °· Restoring tissue flexibility helps normal motion to return to the joints. This allows healthier, less painful movement and activity. °· An effective stretch should be held for at least 30 seconds. °· A stretch should never be painful. You should only feel a gentle lengthening or release in the stretched tissue. °RANGE OF MOTION  Wrist Flexion, Active-Assisted °· Extend your right / left   elbow with your fingers pointing down.* °· Gently pull the back of your hand towards you until you feel a gentle stretch on the top of your forearm. °· Hold this position for __________ seconds. °Repeat __________ times. Complete this exercise __________ times per day.  °*If directed by your physician, physical therapist or athletic trainer, complete this stretch with your elbow bent rather than extended. °RANGE OF MOTION  Wrist Extension, Active-Assisted °· Extend your right / left elbow and turn your palm upwards.* °· Gently pull your palm/fingertips back so your wrist extends and your fingers point more toward the  ground. °· You should feel a gentle stretch on the inside of your forearm. °· Hold this position for __________ seconds. °Repeat __________ times. Complete this exercise __________ times per day. °*If directed by your physician, physical therapist or athletic trainer, complete this stretch with your elbow bent, rather than extended. °RANGE OF MOTION  Supination, Active °· Stand or sit with your elbows at your side. Bend your right / left elbow to 90 degrees. °· Turn your palm upward until you feel a gentle stretch on the inside of your forearm. °· Hold this position for __________ seconds. Slowly release and return to the starting position. °Repeat __________ times. Complete this stretch __________ times per day.  °RANGE OF MOTION  Pronation, Active °· Stand or sit with your elbows at your side. Bend your right / left elbow to 90 degrees. °· Turn your palm downward until you feel a gentle stretch on the top of your forearm. °· Hold this position for __________ seconds. Slowly release and return to the starting position. °Repeat __________ times. Complete this stretch __________ times per day.  °STRETCH - Wrist Flexion °· Place the back of your right / left hand on a tabletop leaving your elbow slightly bent. Your fingers should point away from your body. °· Gently press the back of your hand down onto the table by straightening your elbow. You should feel a stretch on the top of your forearm. °· Hold this position for __________ seconds. °Repeat __________ times. Complete this stretch __________ times per day.  °STRETCH  Wrist Extension °· Place your right / left fingertips on a tabletop leaving your elbow slightly bent. Your fingers should point backwards. °· Gently press your fingers and palm down onto the table by straightening your elbow. You should feel a stretch on the inside of your forearm. °· Hold this position for __________ seconds. °Repeat __________ times. Complete this stretch __________ times per day.   °STRENGTHENING EXERCISES - Wrist Sprain °These exercises may help you when beginning to rehabilitate your injury. They may resolve your symptoms with or without further involvement from your physician, physical therapist or athletic trainer. While completing these exercises, remember:  °· Muscles can gain both the endurance and the strength needed for everyday activities through controlled exercises. °· Complete these exercises as instructed by your physician, physical therapist or athletic trainer. Progress with the resistance and repetition exercises only as your caregiver advises. °STRENGTH Wrist Flexors °· Sit with your right / left forearm palm-up and fully supported. Your elbow should be resting below the height of your shoulder. Allow your wrist to extend over the edge of the surface. °· Loosely holding a __________ weight or a piece of rubber exercise band/tubing, slowly curl your hand up toward your forearm. °· Hold this position for __________ seconds. Slowly lower the wrist back to the starting position in a controlled manner. °Repeat __________ times. Complete this   exercise __________ times per day.  °STRENGTH  Wrist Extensors °· Sit with your right / left forearm palm-down and fully supported. Your elbow should be resting below the height of your shoulder. Allow your wrist to extend over the edge of the surface. °· Loosely holding a __________ weight or a piece of rubber exercise band/tubing, slowly curl your hand up toward your forearm. °· Hold this position for __________ seconds. Slowly lower the wrist back to the starting position in a controlled manner. °Repeat __________ times. Complete this exercise __________ times per day.  °STRENGTH - Ulnar Deviators °· Stand with a ____________________ weight in your right / left hand, or sit holding on to the rubber exercise band/tubing with your opposite arm supported. °· Move your wrist so that your pinkie travels toward your forearm and your thumb moves  away from your forearm. °· Hold this position for __________ seconds and then slowly lower the wrist back to the starting position. °Repeat __________ times. Complete this exercise __________ times per day °STRENGTH - Radial Deviators °· Stand with a ____________________ weight in your °· right / left hand, or sit holding on to the rubber exercise band/tubing with your arm supported. °· Raise your hand upward in front of you or pull up on the rubber tubing. °· Hold this position for __________ seconds and then slowly lower the wrist back to the starting position. °Repeat __________ times. Complete this exercise __________ times per day. °STRENGTH  Forearm Supinators °· Sit with your right / left forearm supported on a table, keeping your elbow below shoulder height. Rest your hand over the edge, palm down. °· Gently grip a hammer or a soup ladle. °· Without moving your elbow, slowly turn your palm and hand upward to a "thumbs-up" position. °· Hold this position for __________ seconds. Slowly return to the starting position. °Repeat __________ times. Complete this exercise __________ times per day.  °STRENGTH  Forearm Pronators °· Sit with your right / left forearm supported on a table, keeping your elbow below shoulder height. Rest your hand over the edge, palm up. °· Gently grip a hammer or a soup ladle. °· Without moving your elbow, slowly turn your palm and hand upward to a "thumbs-up" position. °· Hold this position for __________ seconds. Slowly return to the starting position. °Repeat __________ times. Complete this exercise __________ times per day.  °STRENGTH - Grip °· Grasp a tennis ball, a dense sponge, or a large, rolled sock in your hand. °· Squeeze as hard as you can without increasing any pain. °· Hold this position for __________ seconds. Release your grip slowly. °Repeat __________ times. Complete this exercise __________ times per day.  °Document Released: 11/06/2005 Document Revised: 01/29/2012  Document Reviewed: 02/18/2009 °[employer]® Patient Information ©2014 [employer]. ° °

## 2014-01-23 NOTE — ED Provider Notes (Signed)
CSN: [id number]     Arrival date & time 01/23/14  1006 History   First MD Initiated Contact with Patient 01/23/14 1101     Chief Complaint  Patient presents with  . Follow-up   (Consider location/radiation/quality/duration/timing/severity/associated sxs/prior Treatment) HPI Comments: 53 year old female was seen in the urgent care 7 days ago after she fell at home. She fell at home on her left outstretched arm and presented with pain to the left wrist. She describes her pain located to the ulnar and radial aspect of the wrist. The radiology interpretation was no acute fracture or dislocation. There was a lucent line it was thought not to be a fracture however the patient was asked to return today for repeat films to see if there is additional evidence for possible scaphoid fracture. Patient continues to complain of wrist pain equally along the radial and ulnar aspect. She states that whatever movements she makes with her arm that her third and fourth digits turn color.   Past Medical History  Diagnosis Date  . 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 Migraine without aura, without mention of intractable migraine without mention of status migrainosus   . Cerebral palsy   . Obesity   . Asthma   . Depression with anxiety   . Degenerative arthritis   . Breast cancer   . Gastroesophageal reflux disease    Past Surgical History  Procedure Laterality Date  . Breast lumpectomy    . Hernia repair    . Ovaries remove    . Foot capsule release w/ percutaneous heel cord lengthening, tibial tendon transfer Right   . Oophorectomy Bilateral    Family History  Problem Relation Age of Onset  . Seizures Mother   . Stroke Mother   . Hypertension Father   . Diabetes Father   . Obesity Sister    History  Substance Use Topics  . Smoking status: Never Smoker   . Smokeless tobacco: Never Used   . Alcohol Use: No   OB History   Grav Para Term Preterm Abortions TAB SAB Ect Mult Living                 Review of Systems  Constitutional: Positive for activity change. Negative for fever, chills and fatigue.  HENT: Negative.   Respiratory: Negative.   Cardiovascular: Negative.   Musculoskeletal: Positive for arthralgias. Negative for back pain, gait problem, joint swelling, myalgias and neck pain.       As per HPI  Skin: Negative for color change, pallor and rash.  Neurological: Negative.     Allergies  Sulfa drugs cross reactors  Home Medications   Current Outpatient Rx  Name  Route  Sig  Dispense  Refill  . anastrozole (ARIMIDEX) 1 MG tablet   Oral   Take 1 mg by mouth daily.         . carbamazepine (TEGRETOL) 200 MG tablet   Oral   Take 2 tablets (400 mg total) by mouth 2 (two) times daily.   120 tablet   5   . clorazepate (TRANXENE) 7.5 MG tablet   Oral   Take 7.5 mg by mouth 2 (two) times daily as needed.         Marland Kitchen esomeprazole (NEXIUM) 40 MG capsule   Oral   Take 40 mg by mouth daily before breakfast.         . Fluticasone-Salmeterol (ADVAIR) 250-50 MCG/DOSE AEPB  Inhalation   Inhale 1 puff into the lungs every 12 (twelve) hours.         Marland Kitchen levalbuterol (XOPENEX HFA) 45 MCG/ACT inhaler   Inhalation   Inhale 1-2 puffs into the lungs every 4 (four) hours as needed.         . mometasone (NASONEX) 50 MCG/ACT nasal spray   Nasal   Place 2 sprays into the nose daily.         . montelukast (SINGULAIR) 10 MG tablet   Oral   Take 10 mg by mouth at bedtime.         . rizatriptan (MAXALT) 10 MG tablet   Oral   Take 1 tablet (10 mg total) by mouth as needed. May repeat in 2 hours if needed   10 tablet   3   . sertraline (ZOLOFT) 100 MG tablet   Oral   Take 100 mg by mouth daily.         Marland Kitchen tolterodine (DETROL LA) 4 MG 24 hr capsule   Oral   Take 4 mg by mouth daily.         . traZODone (DESYREL) 100 MG tablet   Oral   Take 100  mg by mouth at bedtime.          BP 116/86  Pulse 90  Temp(Src) 98.2 F (36.8 C) (Oral)  Resp 18  SpO2 97% Physical Exam  Nursing note and vitals reviewed. Constitutional: She is oriented to person, place, and time. She appears well-developed and well-nourished. No distress.  HENT:  Head: Normocephalic and atraumatic.  Eyes: EOM are normal. Pupils are equal, round, and reactive to light.  Neck: Normal range of motion. Neck supple.  Pulmonary/Chest: Effort normal. No respiratory distress.  Musculoskeletal:  The splint applied on the initial visit remains. She is wearing her sling. She maintains her digits in extension and states attempted flexion causes pain along the digits. There is also tenderness in the phalanges. No swelling, deformity or discoloration. I am unable to appreciate the instant color change is that the patient describes with movement of the wrist or arm. Capillary refill of the digits is brisk, temperature normal warmth, normal color no signs of vascular occlusion.  Neurological: She is alert and oriented to person, place, and time. No cranial nerve deficit.  Skin: Skin is warm and dry.    ED Course  Procedures (including critical care time) Labs Review Labs Reviewed - No data to display Imaging Review Dg Wrist Complete Left  01/23/2014   CLINICAL DATA:  Status post fall 1 week ago. Question scaphoid fracture.  EXAM: LEFT WRIST - COMPLETE 3+ VIEW  COMPARISON:  Plain films of the left wrist 01/16/2014.  FINDINGS: The patient is in a fiberglass splint. No fracture is identified. There is a small cyst or enchondroma in the distal pole of the scaphoid. Image bones otherwise appear normal.  IMPRESSION: No acute finding.  Negative for fracture.   Electronically Signed   By: Inge Rise M.D.   On: 01/23/2014 11:53     MDM   1. Follow up   2. Sprain of wrist, left      Remove the splint. The pt is concerned about color change in digits. This examiner can  appreciate minimal color change with acute focus onto the L ring finger. Thre is a small thin streak of slight erythema just proximal to the wedding ring depression mark. Otherwise, nl circulation and exam.  Place thumb spica on. Pt  to remove it periodically and participate in passive and active ROM.  F/U with your PCP or on cal ortho above.   Janne Napoleon, NP 01/23/14 1233  Janne Napoleon, NP 01/23/14 [phone]

## 2014-01-23 NOTE — ED Provider Notes (Signed)
Medical screening examination/treatment/procedure(s) were performed by resident physician or non-physician practitioner and as supervising physician I was immediately available for consultation/collaboration.   Pauline Good MD.   Billy Fischer, MD 01/23/14 [phone]

## 2014-01-23 NOTE — ED Notes (Signed)
Pt here for follow up from 2/27.  Still having pain.  Voices no other concerns at this time.

## 2014-04-24 ENCOUNTER — Telehealth: Payer: Self-pay | Admitting: *Deleted

## 2014-04-24 NOTE — Telephone Encounter (Signed)
Will patient be willing to see Megan instead of Dr. Jannifer Franklin? If yes, please schedule.

## 2014-05-01 ENCOUNTER — Encounter (INDEPENDENT_AMBULATORY_CARE_PROVIDER_SITE_OTHER): Payer: Self-pay

## 2014-05-01 ENCOUNTER — Encounter: Payer: Self-pay | Admitting: Adult Health

## 2014-05-01 ENCOUNTER — Ambulatory Visit (INDEPENDENT_AMBULATORY_CARE_PROVIDER_SITE_OTHER): Payer: BC Managed Care – PPO | Admitting: Adult Health

## 2014-05-01 ENCOUNTER — Ambulatory Visit: Payer: Self-pay | Admitting: Neurology

## 2014-05-01 VITALS — BP 133/92 | HR 99 | Ht 63.5 in | Wt 219.0 lb

## 2014-05-01 DIAGNOSIS — F489 Nonpsychotic mental disorder, unspecified: Secondary | ICD-10-CM

## 2014-05-01 DIAGNOSIS — G43009 Migraine without aura, not intractable, without status migrainosus: Secondary | ICD-10-CM

## 2014-05-01 DIAGNOSIS — F99 Mental disorder, not otherwise specified: Secondary | ICD-10-CM

## 2014-05-01 DIAGNOSIS — G809 Cerebral palsy, unspecified: Secondary | ICD-10-CM

## 2014-05-01 DIAGNOSIS — F3289 Other specified depressive episodes: Secondary | ICD-10-CM

## 2014-05-01 DIAGNOSIS — G40219 Localization-related (focal) (partial) symptomatic epilepsy and epileptic syndromes with complex partial seizures, intractable, without status epilepticus: Secondary | ICD-10-CM

## 2014-05-01 DIAGNOSIS — F32A Depression, unspecified: Secondary | ICD-10-CM | POA: Insufficient documentation

## 2014-05-01 DIAGNOSIS — F329 Major depressive disorder, single episode, unspecified: Secondary | ICD-10-CM

## 2014-05-01 MED ORDER — CARBAMAZEPINE 200 MG PO TABS: ORAL_TABLET | ORAL | Status: DC

## 2014-05-01 NOTE — Progress Notes (Signed)
I have read the note, and I agree with the clinical assessment and plan.  WILLIS,CHARLES KEITH   

## 2014-05-01 NOTE — Patient Instructions (Signed)
Begin taking Carbamezapine 200 mg tablets: take 1 tablet twice a day for 2 weeks then increase to 2 tablets twice a day thereafter Refer to Psychiatry: Sheralyn Boatman     Carbamazepine tablets What is this medicine? CARBAMAZEPINE (kar ba MAZ e peen) is used to control seizures caused by certain types of epilepsy. This medicine is also used to treat nerve related pain. It is not for common aches and pains. This medicine may be used for other purposes; ask your health care provider or pharmacist if you have questions. COMMON BRAND NAME(S): Epitol , Tegretol What should I tell my health care provider before I take this medicine? They need to know if you have any of these conditions: -Asian ancestry -bone marrow disease -glaucoma -heart disease or irregular heartbeat -kidney disease -liver disease -low blood counts, like low white cell, platelet, or red cell counts -porphyria -psychotic disorders -suicidal thoughts, plans, or attempt; a previous suicide attempt by you or a family member -an unusual or allergic reaction to carbamazepine, tricyclic antidepressants, phenytoin, phenobarbital or other medicines, foods, dyes, or preservatives -pregnant or trying to get pregnant -breast-feeding How should I use this medicine? Take this medicine by mouth with a glass of water. Follow the directions on the prescription label. Take this medicine with food. Take your doses at regular intervals. Do not take your medicine more often than directed. Do not stop taking this medicine except on the advice of your doctor or health care professional. A special MedGuide will be given to you by the pharmacist with each prescription and refill. Be sure to read this information carefully each time. Talk to your pediatrician regarding the use of this medicine in children. Special care may be needed. Overdosage: If you think you have taken too much of this medicine contact a poison control center or emergency room  at once. NOTE: This medicine is only for you. Do not share this medicine with others. What if I miss a dose? If you miss a dose, take it as soon as you can. If it is almost time for your next dose, take only that dose. Do not take double or extra doses. What may interact with this medicine? Do not take this medicine with any of the following medications: -delavirdine -MAOIs like Carbex, Eldepryl, Marplan, Nardil, and Parnate -nefazodone -oxcarbazepine This medicine may also interact with the following medications: -acetaminophen -acetazolamide -barbiturate medicines for inducing sleep or treating seizures, like phenobarbital -certain antibiotics like clarithromycin, erythromycin or troleandomycin -cimetidine -cyclosporine -danazol -dicumarol -doxycycline -female hormones, including estrogens and birth control pills -grapefruit juice -isoniazid, INH -levothyroxine and other thyroid hormones -lithium and other medicines to treat mood problems or psychotic disturbances -loratadine -medicines for angina or high blood pressure -medicines for cancer -medicines for depression or anxiety -medicines for sleep -medicines to treat fungal infections, like fluconazole, itraconazole or ketoconazole -medicines used to treat HIV infection or AIDS -methadone -niacinamide -praziquantel -propoxyphene -rifampin or rifabutin -seizure or epilepsy medicine -steroid medicines such as prednisone or cortisone -theophylline -tramadol -warfarin This list may not describe all possible interactions. Give your health care provider a list of all the medicines, herbs, non-prescription drugs, or dietary supplements you use. Also tell them if you smoke, drink alcohol, or use illegal drugs. Some items may interact with your medicine. What should I watch for while using this medicine? Visit your doctor or health care professional for a regular check on your progress. Do not change brands or dosage forms of  this medicine without discussing the  change with your doctor or health care professional. If you are taking this medicine for epilepsy (seizures) do not stop taking it suddenly. This increases the risk of seizures. Wear a Probation officer or necklace. Carry an identification card with information about your condition, medications, and doctor or health care professional. Dennis Bast may get drowsy, dizzy, or have blurred vision. Do not drive, use machinery, or do anything that needs mental alertness until you know how this medicine affects you. To reduce dizzy or fainting spells, do not sit or stand up quickly, especially if you are an older patient. Alcohol can increase drowsiness and dizziness. Avoid alcoholic drinks. Birth control pills may not work properly while you are taking this medicine. Talk to your doctor about using an extra method of birth control. This medicine can make you more sensitive to the sun. Keep out of the sun. If you cannot avoid being in the sun, wear protective clothing and use sunscreen. Do not use sun lamps or tanning beds/booths. The use of this medicine may increase the chance of suicidal thoughts or actions. Pay special attention to how you are responding while on this medicine. Any worsening of mood, or thoughts of suicide or dying should be reported to your health care professional right away. Women who become pregnant while using this medicine may enroll in the [place] Pregnancy Registry by calling [phone]. This registry collects information about the safety of antiepileptic drug use during pregnancy. What side effects may I notice from receiving this medicine? Side effects that you should report to your doctor or health care professional as soon as possible: -allergic reactions like skin rash, itching or hives, swelling of the face, lips, or tongue -breathing problems -changes in vision -confusion -dark urine -fast or irregular  heartbeat -fever or chills, sore throat -mouth ulcers -pain or difficulty passing urine -redness, blistering, peeling or loosening of the skin, including inside the mouth -ringing in the ears -seizures -stomach pain -swollen joints or muscle/joint aches and pains -unusual bleeding or bruising -unusually weak or tired -vomiting -worsening of mood, thoughts or actions of suicide or dying -yellowing of the eyes or skin Side effects that usually do not require medical attention (report to your doctor or health care professional if they continue or are bothersome): -clumsiness or unsteadiness -diarrhea or constipation -headache -increased sweating -nausea This list may not describe all possible side effects. Call your doctor for medical advice about side effects. You may report side effects to FDA at 1-800-FDA-1088. Where should I keep my medicine? Keep out of reach of children. Store at room temperature below 30 degrees C (86 degrees F). Keep container tightly closed. Protect from moisture. Throw away any unused medicine after the expiration date. NOTE: This sheet is a summary. It may not cover all possible information. If you have questions about this medicine, talk to your doctor, pharmacist, or health care provider.  2014, Elsevier/Gold Standard. (2013-01-14 15:31:28)

## 2014-05-01 NOTE — Progress Notes (Signed)
PATIENT: Debbie Peck DOB: Jun 29, 1961  REASON FOR VISIT: follow up HISTORY FROM: patient  HISTORY OF PRESENT ILLNESS: Debbie Peck a 53 year old left handed female with a history of seizures a month through her palsy, migraine headache in psychiatric disease. She returns today for followup. The patient is currently taking generic carbamazepine for seizures but never took it because her husband said they could not afford it. She states that she has had any seizures that she is aware of. However she has a history of having seizures at night, so she is unsure if she has had any. Her husband works night shift so he is unaware if she has had any seizures. Patient reports headaches have been well controlled. She uses Maxalt when needed. She states that in the last month she's had 3 headaches but 2 of those were due to allergies. At the previous visit patient had to stop her depression medication due to not having insurance. Patient states that she has good weeks and bad weeks. This week has been bad due to her cat dying. 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 Patient has not seen a psychiatrist in a while. She was seeing a therapist.   REVIEW OF SYSTEMS: Full 14 system review of systems performed and notable only for:  Constitutional: N/A  Eyes: N/A Ear/Nose/Throat: N/A  Skin: N/A  Cardiovascular: N/A  Respiratory: Wheezing, shortness of breath Gastrointestinal: N/A  Genitourinary: Incontinence of bladder, frequency of urination Hematology/Lymphatic: N/A  Endocrine: N/A Musculoskeletal: Back pain  Allergy/Immunology: Environmental allergies  Neurological: Seizure Psychiatric: Depression, nervous/anxious, hyperactive, disassociative identity disorder? Sleep: N/A   ALLERGIES: Allergies  Allergen Reactions  . Sulfa Drugs Cross Reactors     HOME MEDICATIONS: Outpatient Prescriptions Prior to Visit  Medication Sig Dispense Refill  .  Fluticasone-Salmeterol (ADVAIR) 250-50 MCG/DOSE AEPB Inhale 1 puff into the lungs every 12 (twelve) hours.      Marland Kitchen levalbuterol (XOPENEX HFA) 45 MCG/ACT inhaler Inhale 1-2 puffs into the lungs every 4 (four) hours as needed.      . mometasone (NASONEX) 50 MCG/ACT nasal spray Place 2 sprays into the nose daily.      . montelukast (SINGULAIR) 10 MG tablet Take 10 mg by mouth at bedtime.      . rizatriptan (MAXALT) 10 MG tablet Take 1 tablet (10 mg total) by mouth as needed. May repeat in 2 hours if needed  10 tablet  3  . anastrozole (ARIMIDEX) 1 MG tablet Take 1 mg by mouth daily.      . carbamazepine (TEGRETOL) 200 MG tablet Take 2 tablets (400 mg total) by mouth 2 (two) times daily.  120 tablet  5  . clorazepate (TRANXENE) 7.5 MG tablet Take 7.5 mg by mouth 2 (two) times daily as needed.      Marland Kitchen esomeprazole (NEXIUM) 40 MG capsule Take 40 mg by mouth daily before breakfast.      . sertraline (ZOLOFT) 100 MG tablet Take 100 mg by mouth daily.      Marland Kitchen tolterodine (DETROL LA) 4 MG 24 hr capsule Take 4 mg by mouth daily.      . traZODone (DESYREL) 100 MG tablet Take 100 mg by mouth at bedtime.       No facility-administered medications prior to visit.    PAST MEDICAL HISTORY: Past Medical History  Diagnosis Date  . 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  Migraine without aura, without mention of intractable migraine without mention of status migrainosus   . Cerebral palsy   . Obesity   . Asthma   . Depression with anxiety   . Degenerative arthritis   . Breast cancer   . Gastroesophageal reflux disease     PAST SURGICAL HISTORY: Past Surgical History  Procedure Laterality Date  . Breast lumpectomy    . Hernia repair    . Ovaries remove    . Foot capsule release w/ percutaneous heel cord lengthening, tibial tendon transfer Right   . Oophorectomy Bilateral     FAMILY  HISTORY: Family History  Problem Relation Age of Onset  . Seizures Mother   . Stroke Mother   . Hypertension Father   . Diabetes Father   . Obesity Sister     SOCIAL HISTORY: History   Social History  . Marital Status: Married    Spouse Name: Remo Lipps     Number of Children: 0  . Years of Education: HS   Occupational History  . Disabled     Social History Main Topics  . Smoking status: Never Smoker   . Smokeless tobacco: Never Used  . Alcohol Use: No  . Drug Use: No  . Sexual Activity: Yes   Other Topics Concern  . Not on file   Social History Narrative   Patient is married to Starr and lives with him and roommate.    Patient has no children.    Patient is on disability.    Patient is left handed.    Patient has some community college.       PHYSICAL EXAM  Filed Vitals:   05/01/14 1119  BP: 133/92  Pulse: 99  Height: 5' 3.5" (1.613 m)  Weight: 219 lb (99.338 kg)   Body mass index is 38.18 kg/(m^2). Generalized: Well developed, in no acute distress   Neurological examination  Mentation: Alert oriented to time, place, history taking. Follows all commands speech and language fluent Cranial nerve II-XII:Extraocular movements were full, visual field were full on confrontational test.  Motor: The motor testing reveals 5 over 5 strength of all 4 extremities. Good symmetric motor tone is noted throughout.  Sensory: Sensory testing is intact to soft touch on all 4 extremities. No evidence of extinction is noted.  Coordination: Cerebellar testing reveals good finger-nose-finger and heel-to-shin bilaterally.  Gait and station: Gait is normal. Tandem gait is normal. Romberg is negative. No drift is seen.  Reflexes: Deep tendon reflexes are symmetric and normal bilaterally.     DIAGNOSTIC DATA (LABS, IMAGING, TESTING) - I reviewed patient records, labs, notes, testing and imaging myself where available.  Lab Results  Component Value Date   WBC 6.4 03/08/2011    HGB 13.0 03/08/2011   HCT 37.7 03/08/2011   MCV 91.4 03/08/2011   PLT 263 03/08/2011      Component Value Date/Time   NA 139 03/08/2011 1525   K 3.7 03/08/2011 1525   CL 108 03/08/2011 1525   CO2 24 03/08/2011 1525   GLUCOSE 99 03/08/2011 1525   BUN 7 03/08/2011 1525   CREATININE 0.50 03/08/2011 1525   CALCIUM 9.0 03/08/2011 1525   PROT 6.7 03/08/2011 1525   ALBUMIN 3.7 03/08/2011 1525   AST 24 03/08/2011 1525   ALT 24 03/08/2011 1525   ALKPHOS 51 03/08/2011 1525   BILITOT 0.7 03/08/2011 1525   GFRNONAA >60 04/24/2007 0934   GFRAA  Value: >60        The  eGFR has been calculated using the MDRD equation. This calculation has not been validated in all clinical 04/24/2007 0934     ASSESSMENT AND PLAN 53 y.o. year old female  has a past medical history of Localization-related (focal) (partial) epilepsy and epileptic syndromes with complex partial seizures, without mention of intractable epilepsy; Migraine without aura, without mention of intractable migraine without mention of status migrainosus; Cerebral palsy; Obesity; Asthma; Depression with anxiety; Degenerative arthritis; Breast cancer; and Gastroesophageal reflux disease. here with:  1. Migraine without aura, without mention of intractable migraine without mention of status migrainosus 2. Infantile cerebral palsy, unspecified 3. Localization-related (focal) (partial) epilepsy and epileptic syndromes with complex partial seizures, with intractable epilepsy 4. Depression 5. Psychiatric disorder  Patient never started the carbamazepine that was ordered at the last visit. She has not had any seizures that she is aware of. She does have a history of having seizures during her sleep. She is unaware when she has a seizure during her sleep. She sleeps alone. I advised the patient to restart the carbamazepine. Taking 200 mg tablet twice a day for 2 weeks and then increasing to 2 tablets twice a day thereafter. Migraines have been well controlled using Maxalt as  needed. Patient states that she is depressed and is currently not on any medication. 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 In the past she has seen a psychiatrist and a therapist that she has not seen anyone recently. I will make a referral to psychiatry. Patient should followup in 6 months or sooner if needed.     Ward Givens, MSN, NP-C 05/01/2014, 11:47 AM [facility] [address], [place], [place] [address] [phone]  Note: This document was prepared with digital dictation and possible smart phrase technology. Any transcriptional errors that result from this process are unintentional.

## 2014-10-30 ENCOUNTER — Ambulatory Visit (INDEPENDENT_AMBULATORY_CARE_PROVIDER_SITE_OTHER): Payer: BC Managed Care – PPO | Admitting: Adult Health

## 2014-10-30 ENCOUNTER — Encounter: Payer: Self-pay | Admitting: Adult Health

## 2014-10-30 VITALS — BP 124/85 | HR 91 | Temp 97.0°F | Ht 64.0 in | Wt 215.0 lb

## 2014-10-30 DIAGNOSIS — F99 Mental disorder, not otherwise specified: Secondary | ICD-10-CM

## 2014-10-30 DIAGNOSIS — G43009 Migraine without aura, not intractable, without status migrainosus: Secondary | ICD-10-CM

## 2014-10-30 DIAGNOSIS — G40219 Localization-related (focal) (partial) symptomatic epilepsy and epileptic syndromes with complex partial seizures, intractable, without status epilepticus: Secondary | ICD-10-CM

## 2014-10-30 DIAGNOSIS — G809 Cerebral palsy, unspecified: Secondary | ICD-10-CM

## 2014-10-30 MED ORDER — CARBAMAZEPINE 200 MG PO TABS: 200.0000 mg | ORAL_TABLET | Freq: Two times a day (BID) | ORAL | Status: DC

## 2014-10-30 NOTE — Progress Notes (Signed)
PATIENT: Debbie Peck DOB: [birth year]  REASON FOR VISIT: follow up HISTORY FROM: patient  HISTORY OF PRESENT ILLNESS: Debbie Peck is a 52 year old female with a history of infantile cerebral palsy, seizures, headache and psychiatric disorder. She returns today for follow-up. She is not currently taking carbamazepine 400 mg twice a day. She denies any recent seizures. She states that she does want to be on medication for her seizures but didn't get her prescription filled at this last visit. The patient does not operate a motor vehicle. Patient also has a history of headaches. She uses Maxalt for her headaches. She states in the last month she has not has any  headaches. She states that she is having some sinus issues now. Patient states that she has improved her sleep hygiene. She goes to bed at a normal regular time. She tries to reduce her TV time before bed. 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 She was referred to a psychiatrist. She states that she has to pay 100 dollars up front. She states that she has to call and get an appointment set up. She now has insurance.   HISTORY 05/01/14: history of seizures a month through her palsy, migraine headache in psychiatric disease. She returns today for followup. The patient is currently taking generic carbamazepine for seizures but never took it because her husband said they could not afford it. She states that she has had any seizures that she is aware of. However she has a history of having seizures at night, so she is unsure if she has had any. Her husband works night shift so he is unaware if she has had any seizures. Patient reports headaches have been well controlled. She uses Maxalt when needed. She states that in the last month she's had 3 headaches but 2 of those were due to allergies. At the previous visit patient had to stop her depression medication due to not having insurance. Patient states that she  has good weeks and bad weeks. This week has been bad due to her cat dying. 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 Patient has not seen a psychiatrist in a while. She was seeing a therapist  REVIEW OF SYSTEMS: Out of a complete 14 system review of symptoms, the patient complains only of the following symptoms, and all other reviewed systems are negative.  Ringing in ears Frequent waking Sleep talking Joint pain, back pain Seizure Depression Nervous/anxious Hyperactive   ALLERGIES: Allergies  Allergen Reactions  . Sulfa Drugs Cross Reactors     HOME MEDICATIONS: Outpatient Prescriptions Prior to Visit  Medication Sig Dispense Refill  . carbamazepine (TEGRETOL) 200 MG tablet Take 1 tablet BID for 2 weeks then increase to 2 tablets BID thereafter. 120 tablet 5  . Fluticasone-Salmeterol (ADVAIR) 250-50 MCG/DOSE AEPB Inhale 1 puff into the lungs every 12 (twelve) hours.    Marland Kitchen levalbuterol (XOPENEX HFA) 45 MCG/ACT inhaler Inhale 1-2 puffs into the lungs every 4 (four) hours as needed.    . mometasone (NASONEX) 50 MCG/ACT nasal spray Place 2 sprays into the nose daily.    . montelukast (SINGULAIR) 10 MG tablet Take 10 mg by mouth at bedtime.    . rizatriptan (MAXALT) 10 MG tablet Take 1 tablet (10 mg total) by mouth as needed. May repeat in 2 hours if needed 10 tablet 3   No facility-administered medications prior to visit.    PAST MEDICAL HISTORY: Past Medical  History  Diagnosis Date  . 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 Migraine without aura, without mention of intractable migraine without mention of status migrainosus   . Cerebral palsy   . Obesity   . Asthma   . Depression with anxiety   . Degenerative arthritis   . Breast cancer   . Gastroesophageal reflux disease     PAST SURGICAL HISTORY: Past Surgical  History  Procedure Laterality Date  . Breast lumpectomy    . Hernia repair    . Ovaries remove    . Foot capsule release w/ percutaneous heel cord lengthening, tibial tendon transfer Right   . Oophorectomy Bilateral     FAMILY HISTORY: Family History  Problem Relation Age of Onset  . Seizures Mother   . Stroke Mother   . Hypertension Father   . Diabetes Father   . Obesity Sister     SOCIAL HISTORY: History   Social History  . Marital Status: Married    Spouse Name: Remo Lipps     Number of Children: 0  . Years of Education: HS   Occupational History  . Disabled     Social History Main Topics  . Smoking status: Never Smoker   . Smokeless tobacco: Never Used  . Alcohol Use: No  . Drug Use: No  . Sexual Activity: Yes   Other Topics Concern  . Not on file   Social History Narrative   Patient is married to [place] and lives with him and roommate.    Patient has no children.    Patient is on disability.    Patient is left handed.    Patient has some community college.       PHYSICAL EXAM  Filed Vitals:   10/30/14 0956  Weight: 215 lb (97.523 kg)   Body mass index is 37.48 kg/(m^2).  Generalized: Well developed, in no acute distress   Neurological examination  Mentation: Alert oriented to time, place, history taking. Follows all commands speech and language fluent Cranial nerve II-XII: Pupils were equal round reactive to light. Extraocular movements were full, visual field were full on confrontational test. Facial sensation and strength were normal. Uvula tongue midline. Head turning and shoulder shrug  were normal and symmetric. Motor: The motor testing reveals 5 over 5 strength of all 4 extremities. Good symmetric motor tone is noted throughout.  Sensory: Sensory testing is intact to soft touch on all 4 extremities. No evidence of extinction is noted.  Coordination: Cerebellar testing reveals good finger-nose-finger and heel-to-shin bilaterally.  Gait and  station: Gait is normal. Tandem gait is normal. Romberg is negative. No drift is seen.  Reflexes: Deep tendon reflexes are symmetric and normal bilaterally.    DIAGNOSTIC DATA (LABS, IMAGING, TESTING) - I reviewed patient records, labs, notes, testing and imaging myself where available.  Lab Results  Component Value Date   WBC 6.4 03/08/2011   HGB 13.0 03/08/2011   HCT 37.7 03/08/2011   MCV 91.4 03/08/2011   PLT 263 03/08/2011      Component Value Date/Time   NA 139 03/08/2011 1525   K 3.7 03/08/2011 1525   CL 108 03/08/2011 1525   CO2 24 03/08/2011 1525   GLUCOSE 99 03/08/2011 1525   BUN 7 03/08/2011 1525   CREATININE 0.50 03/08/2011 1525   CALCIUM 9.0 03/08/2011 1525   PROT 6.7 03/08/2011 1525   ALBUMIN 3.7 03/08/2011 1525   AST 24 03/08/2011  1525   ALT 24 03/08/2011 1525   ALKPHOS 51 03/08/2011 1525   BILITOT 0.7 03/08/2011 1525   GFRNONAA >60 04/24/2007 0934   GFRAA  04/24/2007 0934    >60        The eGFR has been calculated using the MDRD equation. This calculation has not been validated in all clinical      ASSESSMENT AND PLAN 53 y.o. year old female  has a past medical history of Localization-related (focal) (partial) epilepsy and epileptic syndromes with complex partial seizures, without mention of intractable epilepsy; Migraine without aura, without mention of intractable migraine without mention of status migrainosus; Cerebral palsy; Obesity; Asthma; Depression with anxiety; Degenerative arthritis; Breast cancer; and Gastroesophageal reflux disease. here with:  1. Migraine 2. Seizures 3. Psychiatric disorder  The patient was prescribed carbamazepine at the last visit. She states that she did not get this filled. She has not been taking any medication since the last visit. she denies any recent seizures. The patient now has insurance and would like to restart the carbamazepine. The patient will start carbamazepine 200 mg twice a day.  If She is unable to  tolerate this medication she should let us know. The patient's headaches have been controlled. She is currently in the process of setting up an appointment with a psychiatrist. If the patient's symptoms worsen or she develops new symptoms she should let us know. Otherwise she will follow up in 6 months.   Ward Givens, MSN, NP-C 10/30/2014, 10:02 AM [facility] [address] [place], [place] [address] [phone]  Note: This document was prepared with digital dictation and possible smart phrase technology. Any transcriptional errors that result from this process are unintentional.

## 2014-10-30 NOTE — Patient Instructions (Signed)
Carbamazepine tablets What is this medicine? CARBAMAZEPINE (kar ba MAZ e peen) is used to control seizures caused by certain types of epilepsy. This medicine is also used to treat nerve related pain. It is not for common aches and pains. This medicine may be used for other purposes; ask your health care provider or pharmacist if you have questions. COMMON BRAND NAME(S): Epitol, Tegretol What should I tell my health care provider before I take this medicine? They need to know if you have any of these conditions: -Asian ancestry -bone marrow disease -glaucoma -heart disease or irregular heartbeat -kidney disease -liver disease -low blood counts, like low white cell, platelet, or red cell counts -porphyria -psychotic disorders -suicidal thoughts, plans, or attempt; a previous suicide attempt by you or a family member -an unusual or allergic reaction to carbamazepine, tricyclic antidepressants, phenytoin, phenobarbital or other medicines, foods, dyes, or preservatives -pregnant or trying to get pregnant -breast-feeding How should I use this medicine? Take this medicine by mouth with a glass of water. Follow the directions on the prescription label. Take this medicine with food. Take your doses at regular intervals. Do not take your medicine more often than directed. Do not stop taking this medicine except on the advice of your doctor or health care professional. A special MedGuide will be given to you by the pharmacist with each prescription and refill. Be sure to read this information carefully each time. Talk to your pediatrician regarding the use of this medicine in children. Special care may be needed. Overdosage: If you think you have taken too much of this medicine contact a poison control center or emergency room at once. NOTE: This medicine is only for you. Do not share this medicine with others. What if I miss a dose? If you miss a dose, take it as soon as you can. If it is almost  time for your next dose, take only that dose. Do not take double or extra doses. What may interact with this medicine? Do not take this medicine with any of the following medications: -delavirdine -MAOIs like Carbex, Eldepryl, Marplan, Nardil, and Parnate -nefazodone -oxcarbazepine This medicine may also interact with the following medications: -acetaminophen -acetazolamide -barbiturate medicines for inducing sleep or treating seizures, like phenobarbital -certain antibiotics like clarithromycin, erythromycin or troleandomycin -cimetidine -cyclosporine -danazol -dicumarol -doxycycline -female hormones, including estrogens and birth control pills -grapefruit juice -isoniazid, INH -levothyroxine and other thyroid hormones -lithium and other medicines to treat mood problems or psychotic disturbances -loratadine -medicines for angina or high blood pressure -medicines for cancer -medicines for depression or anxiety -medicines for sleep -medicines to treat fungal infections, like fluconazole, itraconazole or ketoconazole -medicines used to treat HIV infection or AIDS -methadone -niacinamide -praziquantel -propoxyphene -rifampin or rifabutin -seizure or epilepsy medicine -steroid medicines such as prednisone or cortisone -theophylline -tramadol -warfarin This list may not describe all possible interactions. Give your health care provider a list of all the medicines, herbs, non-prescription drugs, or dietary supplements you use. Also tell them if you smoke, drink alcohol, or use illegal drugs. Some items may interact with your medicine. What should I watch for while using this medicine? Visit your doctor or health care professional for a regular check on your progress. Do not change brands or dosage forms of this medicine without discussing the change with your doctor or health care professional. If you are taking this medicine for epilepsy (seizures) do not stop taking it suddenly.  This increases the risk of seizures. Wear a Probation officer  or necklace. Carry an identification card with information about your condition, medications, and doctor or health care professional. Dennis Bast may get drowsy, dizzy, or have blurred vision. Do not drive, use machinery, or do anything that needs mental alertness until you know how this medicine affects you. To reduce dizzy or fainting spells, do not sit or stand up quickly, especially if you are an older patient. Alcohol can increase drowsiness and dizziness. Avoid alcoholic drinks. Birth control pills may not work properly while you are taking this medicine. Talk to your doctor about using an extra method of birth control. This medicine can make you more sensitive to the sun. Keep out of the sun. If you cannot avoid being in the sun, wear protective clothing and use sunscreen. Do not use sun lamps or tanning beds/booths. The use of this medicine may increase the chance of suicidal thoughts or actions. Pay special attention to how you are responding while on this medicine. Any worsening of mood, or thoughts of suicide or dying should be reported to your health care professional right away. Women who become pregnant while using this medicine may enroll in the [place] Pregnancy Registry by calling [phone]. This registry collects information about the safety of antiepileptic drug use during pregnancy. What side effects may I notice from receiving this medicine? Side effects that you should report to your doctor or health care professional as soon as possible: -allergic reactions like skin rash, itching or hives, swelling of the face, lips, or tongue -breathing problems -changes in vision -confusion -dark urine -fast or irregular heartbeat -fever or chills, sore throat -mouth ulcers -pain or difficulty passing urine -redness, blistering, peeling or loosening of the skin, including inside the mouth -ringing in  the ears -seizures -stomach pain -swollen joints or muscle/joint aches and pains -unusual bleeding or bruising -unusually weak or tired -vomiting -worsening of mood, thoughts or actions of suicide or dying -yellowing of the eyes or skin Side effects that usually do not require medical attention (report to your doctor or health care professional if they continue or are bothersome): -clumsiness or unsteadiness -diarrhea or constipation -headache -increased sweating -nausea This list may not describe all possible side effects. Call your doctor for medical advice about side effects. You may report side effects to FDA at 1-800-FDA-1088. Where should I keep my medicine? Keep out of reach of children. Store at room temperature below 30 degrees C (86 degrees F). Keep container tightly closed. Protect from moisture. Throw away any unused medicine after the expiration date. NOTE: This sheet is a summary. It may not cover all possible information. If you have questions about this medicine, talk to your doctor, pharmacist, or health care provider.  2015, Elsevier/Gold Standard. (2013-01-14 15:31:28)

## 2014-10-30 NOTE — Progress Notes (Signed)
I have read the note, and I agree with the clinical assessment and plan.  Debbie Peck,Debbie Peck   

## 2015-05-14 ENCOUNTER — Ambulatory Visit: Payer: BC Managed Care – PPO | Admitting: Adult Health

## 2015-05-18 ENCOUNTER — Ambulatory Visit (INDEPENDENT_AMBULATORY_CARE_PROVIDER_SITE_OTHER): Payer: BLUE CROSS/BLUE SHIELD | Admitting: Adult Health

## 2015-05-18 ENCOUNTER — Encounter: Payer: Self-pay | Admitting: Adult Health

## 2015-05-18 VITALS — BP 125/90 | HR 91 | Ht 64.0 in | Wt 214.0 lb

## 2015-05-18 DIAGNOSIS — G809 Cerebral palsy, unspecified: Secondary | ICD-10-CM | POA: Diagnosis not present

## 2015-05-18 DIAGNOSIS — R51 Headache: Secondary | ICD-10-CM | POA: Diagnosis not present

## 2015-05-18 DIAGNOSIS — R519 Headache, unspecified: Secondary | ICD-10-CM

## 2015-05-18 DIAGNOSIS — R569 Unspecified convulsions: Secondary | ICD-10-CM | POA: Diagnosis not present

## 2015-05-18 NOTE — Progress Notes (Signed)
PATIENT: Debbie Peck DOB: 05/19/1961  REASON FOR VISIT: follow up-  Seizures, headache, history of infantile cerebral palsy HISTORY FROM: patient  HISTORY OF PRESENT ILLNESS: Debbie Peck  Is a 54 year old female with a history of infantile cerebral palsy, seizures, headache and psychiatric disorder. She returns today for follow-up. The patient reports that she has not had any seizure events. She is currently not on any medication for seizures. He tried to start the patient on carbamazepine but the patient never got this medication filled. The patient also reports that she is not had any migraine headaches recently. She does state that she may have a sinus headache but nothing that is similar to a migraine. Patient was referred to psychiatry however she was unable to keep her appointment due to the co-pay. She is hoping when she has her tax return she can use that Monday to schedule an appointment. Patient denies any new neurological symptoms. She returns today for follow-up.  HISTORY 10/30/14: Debbie Peck is a 54 year old female with a history of infantile cerebral palsy, seizures, headache and psychiatric disorder. She returns today for follow-up. She is not currently taking carbamazepine 400 mg twice a day. She denies any recent seizures. She states that she does want to be on medication for her seizures but didn't get her prescription filled at this last visit. The patient does not operate a motor vehicle. Patient also has a history of headaches. She uses Maxalt for her headaches. She states in the last month she has not has any headaches. She states that she is having some sinus issues now. Patient states that she has improved her sleep hygiene. She goes to bed at a normal regular time. She tries to reduce her TV time before bed. 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 She was referred to a psychiatrist. She states that she has to pay 100 dollars up front.  She states that she has to call and get an appointment set up. She now has insurance.   HISTORY 05/01/14: history of seizures a month through her palsy, migraine headache in psychiatric disease. She returns today for followup. The patient is currently taking generic carbamazepine for seizures but never took it because her husband said they could not afford it. She states that she has had any seizures that she is aware of. However she has a history of having seizures at night, so she is unsure if she has had any. Her husband works night shift so he is unaware if she has had any seizures. Patient reports headaches have been well controlled. She uses Maxalt when needed. She states that in the last month she's had 3 headaches but 2 of those were due to allergies. At the previous visit patient had to stop her depression medication due to not having insurance. Patient states that she has good weeks and bad weeks. This week has been bad due to her cat dying. 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 Patient has not seen a psychiatrist in a while. She was seeing a therapist  REVIEW OF SYSTEMS: Out of a complete 14 system review of symptoms, the patient complains only of the following symptoms, and all other reviewed systems are negative.   ear discharge , environment allergies, bruise easily, depression, nervous/anxious, hyperactive  ALLERGIES: Allergies  Allergen Reactions  . Sulfa Drugs Cross Reactors     HOME MEDICATIONS: Outpatient Prescriptions Prior to Visit  Medication Sig Dispense  Refill  . carbamazepine (TEGRETOL) 200 MG tablet Take 1 tablet (200 mg total) by mouth 2 (two) times daily. (Patient not taking: Reported on 05/18/2015) 60 tablet 5   No facility-administered medications prior to visit.    PAST MEDICAL HISTORY: Past Medical History  Diagnosis Date  . Localization-related (focal) (partial) epilepsy and epileptic syndromes with  complex partial seizures, without mention of intractable epilepsy   . Migraine without aura, without mention of intractable migraine without mention of status migrainosus   . Cerebral palsy   . Obesity   . Asthma   . Depression with anxiety   . Degenerative arthritis   . Breast cancer   . Gastroesophageal reflux disease     PAST SURGICAL HISTORY: Past Surgical History  Procedure Laterality Date  . Breast lumpectomy    . Hernia repair    . Ovaries remove    . Foot capsule release w/ percutaneous heel cord lengthening, tibial tendon transfer Right   . Oophorectomy Bilateral     FAMILY HISTORY: Family History  Problem Relation Age of Onset  . Seizures Mother   . Stroke Mother   . Hypertension Father   . Diabetes Father   . Obesity Sister     SOCIAL HISTORY: History   Social History  . Marital Status: Married    Spouse Name: Remo Lipps   . Number of Children: 0  . Years of Education: HS   Occupational History  . Disabled     Social History Main Topics  . Smoking status: Never Smoker   . Smokeless tobacco: Never Used  . Alcohol Use: No  . Drug Use: No  . Sexual Activity: Yes   Other Topics Concern  . Not on file   Social History Narrative   Patient is married to [place] and lives with him and roommate.    Patient has no children.    Patient is on disability.    Patient is left handed.    Patient has some community college.       PHYSICAL EXAM  Filed Vitals:   05/18/15 1302  BP: 125/90  Pulse: 91  Height: 5\' 4"  (1.626 m)  Weight: 214 lb (97.07 kg)   Body mass index is 36.72 kg/(m^2).  Generalized: Well developed, in no acute distress   Neurological examination  Mentation: Alert oriented to time, place, history taking. Follows all commands speech and language fluent Cranial nerve II-XII: Pupils were equal round reactive to light. Extraocular movements were full, visual field were full on confrontational test. Facial sensation and strength were normal.  Uvula tongue midline. Head turning and shoulder shrug  were normal and symmetric. Motor: The motor testing reveals 5 over 5 strength of all 4 extremities. Good symmetric motor tone is noted throughout.  Sensory: Sensory testing is intact to soft touch on all 4 extremities. No evidence of extinction is noted.  Coordination: Cerebellar testing reveals good finger-nose-finger and heel-to-shin bilaterally.  Gait and station: Gait is normal. Tandem gait is normal. Romberg is negative. No drift is seen.  Reflexes: Deep tendon reflexes are symmetric and normal bilaterally.   DIAGNOSTIC DATA (LABS, IMAGING, TESTING) - I reviewed patient records, labs, notes, testing and imaging myself where available.     ASSESSMENT AND PLAN 54 y.o. 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 here with:   1. Seizures  2. Headache  3. Infantile cerebra palsy   Overall the patient  Is doing well. She has not had any seizure episodes. For now we will continue to monitor this conservatively. Her headaches have also resolved.  Patient encouraged to follow-up with psychiatry when she is financially able. She verbalized understanding. If her symptoms worsen or she develops new symptoms she she'll let us know. Otherwise she will follow-up one year or sooner if needed     Ward Givens, MSN, NP-C 05/18/2015, 1:33 PM [facility] [address] [place], [place] [address] [phone]  Note: This document was prepared with digital dictation and possible smart phrase technology. Any transcriptional errors that result from  this process are unintentional.

## 2015-05-18 NOTE — Progress Notes (Signed)
I have read the note, and I agree with the clinical assessment and plan.  Camerin Ladouceur KEITH   

## 2016-03-14 DIAGNOSIS — M1990 Unspecified osteoarthritis, unspecified site: Secondary | ICD-10-CM | POA: Insufficient documentation

## 2016-03-14 DIAGNOSIS — J309 Allergic rhinitis, unspecified: Secondary | ICD-10-CM | POA: Insufficient documentation

## 2016-03-14 DIAGNOSIS — H902 Conductive hearing loss, unspecified: Secondary | ICD-10-CM | POA: Insufficient documentation

## 2016-03-14 DIAGNOSIS — G40909 Epilepsy, unspecified, not intractable, without status epilepticus: Secondary | ICD-10-CM | POA: Insufficient documentation

## 2016-05-17 ENCOUNTER — Encounter: Payer: Self-pay | Admitting: Adult Health

## 2016-05-17 ENCOUNTER — Ambulatory Visit (INDEPENDENT_AMBULATORY_CARE_PROVIDER_SITE_OTHER): Payer: 59 | Admitting: Adult Health

## 2016-05-17 VITALS — BP 134/96 | HR 88 | Ht 64.0 in | Wt 225.4 lb

## 2016-05-17 DIAGNOSIS — G809 Cerebral palsy, unspecified: Secondary | ICD-10-CM | POA: Diagnosis not present

## 2016-05-17 DIAGNOSIS — R569 Unspecified convulsions: Secondary | ICD-10-CM

## 2016-05-17 DIAGNOSIS — G43009 Migraine without aura, not intractable, without status migrainosus: Secondary | ICD-10-CM

## 2016-05-17 MED ORDER — RIZATRIPTAN BENZOATE 10 MG PO TABS: ORAL_TABLET | ORAL | Status: DC

## 2016-05-17 NOTE — Progress Notes (Signed)
PATIENT: Debbie Peck DOB: [birth year]  REASON FOR VISIT: follow up- seizures, migraine HISTORY FROM: patient  HISTORY OF PRESENT ILLNESS: Ms. Debbie Peck is a 55 year old female with a history of infantile cerebral palsy, seizures and headache. She returns today for follow-up. The patient is not on any seizure medication. She denies any seizure events. She states that she has had 2 headaches in the last 6 months. She states that one of the headaches caused temporary loss of vision in the right. Reports that her headaches are typically located across the forehead and extends to the occipital region. Most often she does have pain behind the right eye. In the past Excedrin Migraine has resolved her headaches but she states these 2 headaches were not relieved with Excedrin. In the past she has been on Maxalt with good benefit. She returns today for an evaluation.  HISTORY 05/18/15 (MM): Ms. Debbie Peck Is a 55 year old female with a history of infantile cerebral palsy, seizures, headache and psychiatric disorder. She returns today for follow-up. The patient reports that she has not had any seizure events. She is currently not on any medication for seizures. He tried to start the patient on carbamazepine but the patient never got this medication filled. The patient also reports that she is not had any migraine headaches recently. She does state that she may have a sinus headache but nothing that is similar to a migraine. Patient was referred to psychiatry however she was unable to keep her appointment due to the co-pay. She is hoping when she has her tax return she can use that Monday to schedule an appointment. Patient denies any new neurological symptoms. She returns today for follow-up.  HISTORY 10/30/14: Ms. Debbie Peck is a 55 year old female with a history of infantile cerebral palsy, seizures, headache and psychiatric disorder. She returns today for follow-up. She is not currently taking carbamazepine 400 mg twice  a day. She denies any recent seizures. She states that she does want to be on medication for her seizures but didn't get her prescription filled at this last visit. The patient does not operate a motor vehicle. Patient also has a history of headaches. She uses Maxalt for her headaches. She states in the last month she has not has any headaches. She states that she is having some sinus issues now. Patient states that she has improved her sleep hygiene. She goes to bed at a normal regular time. She tries to reduce her TV time before bed. 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 She was referred to a psychiatrist. She states that she has to pay 100 dollars up front. She states that she has to call and get an appointment set up. She now has insurance.   HISTORY 05/01/14: history of seizures a month through her palsy, migraine headache in psychiatric disease. She returns today for followup. The patient is currently taking generic carbamazepine for seizures but never took it because her husband said they could not afford it. She states that she has had any seizures that she is aware of. However she has a history of having seizures at night, so she is unsure if she has had any. Her husband works night shift so he is unaware if she has had any seizures. Patient reports headaches have been well controlled. She uses Maxalt when needed. She states that in the last month she's had 3 headaches but 2 of those were due to allergies. At the previous visit patient had to stop  her depression medication due to not having insurance. Patient states that she has good weeks and bad weeks. This week has been bad due to her cat dying. 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 Patient has not seen a psychiatrist in a while. She was seeing a therapist  REVIEW OF SYSTEMS: Out of a complete 14 system review of symptoms, the patient complains only  of the following symptoms, and all other reviewed systems are negative.  Wheezing, frequency of urination, headache  ALLERGIES: Allergies  Allergen Reactions  . Sulfa Drugs Cross Reactors     HOME MEDICATIONS: No outpatient prescriptions prior to visit.   No facility-administered medications prior to visit.    PAST MEDICAL HISTORY: Past Medical History  Diagnosis Date  . 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 Migraine without aura, without mention of intractable migraine without mention of status migrainosus   . Cerebral palsy ([place])   . Obesity   . Asthma   . Depression with anxiety   . Degenerative arthritis   . Breast cancer ([place])   . Gastroesophageal reflux disease     PAST SURGICAL HISTORY: Past Surgical History  Procedure Laterality Date  . Breast lumpectomy    . Hernia repair    . Ovaries remove    . Foot capsule release w/ percutaneous heel cord lengthening, tibial tendon transfer Right   . Oophorectomy Bilateral     FAMILY HISTORY: Family History  Problem Relation Age of Onset  . Seizures Mother   . Stroke Mother   . Hypertension Father   . Diabetes Father   . Obesity Sister     SOCIAL HISTORY: Social History   Social History  . Marital Status: Married    Spouse Name: Remo Lipps   . Number of Children: 0  . Years of Education: HS   Occupational History  . Disabled     Social History Main Topics  . Smoking status: Never Smoker   . Smokeless tobacco: Never Used  . Alcohol Use: No  . Drug Use: No  . Sexual Activity: Yes   Other Topics Concern  . Not on file   Social History Narrative   Patient is married to [place] and lives with him and roommate.    Patient has no children.    Patient is on disability.    Patient is left handed.    Patient has some community college.       PHYSICAL EXAM  Filed Vitals:   05/17/16 1252  BP:  134/96  Pulse: 88  Height: 5\' 4"  (1.626 m)  Weight: 225 lb 6.4 oz (102.241 kg)   Body mass index is 38.67 kg/(m^2).  Generalized: Well developed, in no acute distress   Neurological examination  Mentation: Alert oriented to time, place, history taking. Follows all commands speech and language fluent Cranial nerve II-XII: Pupils were equal round reactive to light. Extraocular movements were full, visual field were full on confrontational test. Facial sensation and strength were normal. Uvula tongue midline. Head turning and shoulder shrug  were normal and symmetric. Motor: The motor testing reveals 5 over 5 strength of all 4 extremities. Good symmetric motor tone is noted throughout.  Sensory: Sensory testing is intact to soft touch on all 4 extremities. No evidence of extinction is noted.  Coordination: Cerebellar testing reveals good finger-nose-finger and heel-to-shin bilaterally.  Gait and station: Gait is normal. Tandem gait  is Unsteady. Romberg is negative. No drift is seen.  Reflexes: Deep tendon reflexes are symmetric and normal bilaterally.   DIAGNOSTIC DATA (LABS, IMAGING, TESTING) - I reviewed patient records, labs, notes, testing and imaging myself where available.   ASSESSMENT AND PLAN 55 y.o. 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 here with:  1. Seizures 2. Headache  The patient has not had any seizure activity. She is also not on any medication. The patient has had 2 migraines with temporary loss of vision within the last 6 months. I will refill her Maxalt for her. Advised that if  her symptoms worsen or she develops new symptoms she should let us know. She'll follow-up in one year or sooner if needed.   Ward Givens, MSN, NP-C 05/17/2016, 1:09 PM [facility] [address] [place], [place] [address] [phone]

## 2016-05-17 NOTE — Progress Notes (Signed)
I have read the note, and I agree with the clinical assessment and plan.  Kassidy Frankson KEITH   

## 2016-05-17 NOTE — Patient Instructions (Signed)
Use Maxalt as needed for migraine If your symptoms worsen or you develop new symptoms please let us know.

## 2017-05-17 ENCOUNTER — Ambulatory Visit: Payer: 59 | Admitting: Neurology

## 2017-07-10 ENCOUNTER — Ambulatory Visit (INDEPENDENT_AMBULATORY_CARE_PROVIDER_SITE_OTHER): Payer: 59 | Admitting: Neurology

## 2017-07-10 ENCOUNTER — Encounter (INDEPENDENT_AMBULATORY_CARE_PROVIDER_SITE_OTHER): Payer: Self-pay

## 2017-07-10 ENCOUNTER — Encounter: Payer: Self-pay | Admitting: Neurology

## 2017-07-10 VITALS — BP 128/86 | HR 72 | Ht 64.0 in | Wt 227.0 lb

## 2017-07-10 DIAGNOSIS — G43009 Migraine without aura, not intractable, without status migrainosus: Secondary | ICD-10-CM | POA: Diagnosis not present

## 2017-07-10 DIAGNOSIS — G40219 Localization-related (focal) (partial) symptomatic epilepsy and epileptic syndromes with complex partial seizures, intractable, without status epilepticus: Secondary | ICD-10-CM | POA: Diagnosis not present

## 2017-07-10 DIAGNOSIS — G809 Cerebral palsy, unspecified: Secondary | ICD-10-CM

## 2017-07-10 MED ORDER — RIZATRIPTAN BENZOATE 10 MG PO TABS: ORAL_TABLET | ORAL | 5 refills | Status: DC

## 2017-07-10 NOTE — Progress Notes (Signed)
Reason for visit: Migraine headache  Debbie Peck is an 56 y.o. female  History of present illness:  Debbie Peck is a 56 year old left handed white female with a history of migraine headaches, cerebral palsy, and a history of seizures. The patient has done well since last seen, she is having 1 or 2 headaches a week, this is aggravated by her allergies that occurred during the summertime. She does much better in the wintertime after the first frost occurs. The patient takes Maxalt for the headache which seems to help. She indicates that she has not had a seizure in over 3 years, she has gone off of her seizure medications. She does operate a Teacher, music. She denies any new medical issues that have come up since last seen. With her headaches, she may have some nausea and photophobia and phonophobia.  Past Medical History:  Diagnosis Date  . Asthma   . Breast cancer ([place])   . Cerebral palsy ([place])   . Degenerative arthritis   . Depression with anxiety   . Gastroesophageal reflux disease   . 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 Migraine without aura, without mention of intractable migraine without mention of status migrainosus   . Obesity   . Sleep apnea    father    Past Surgical History:  Procedure Laterality Date  . BREAST LUMPECTOMY    . FOOT CAPSULE RELEASE W/ PERCUTANEOUS HEEL CORD LENGTHENING, TIBIAL TENDON TRANSFER Right   . HERNIA REPAIR    . OOPHORECTOMY Bilateral   . ovaries remove      Family History  Problem Relation Age of Onset  . Seizures Mother   . Stroke Mother   . Hypertension Father   . Diabetes Father   . Obesity Sister     Social history:  reports that she has never smoked. She has never used smokeless tobacco. She reports that she does not drink alcohol or use drugs.    Allergies  Allergen Reactions  . Sulfa Drugs Cross Reactors      Medications:  Prior to Admission medications   Medication Sig Start Date End Date Taking? Authorizing Provider  albuterol (PROAIR HFA) 108 (90 Base) MCG/ACT inhaler Inhale 1 puff into the lungs as needed.    Yes [provider]  aspirin-acetaminophen-caffeine (EXCEDRIN MIGRAINE) [phone] MG tablet Take 1 tablet by mouth every 6 (six) hours as needed for headache.   Yes [provider]  fluticasone (FLONASE) 50 MCG/ACT nasal spray Place 1 spray into both nostrils daily.   Yes [provider]  rizatriptan (MAXALT) 10 MG tablet Take 1 tablet at the onset of migraine. May repeat in 2 hours if needed. Do not exceed 2 tablets in 24 hours. 05/17/16  Yes Ward Givens, NP  UNKNOWN TO PATIENT Take 1 Dose by mouth 2 (two) times daily.   Yes [provider]    ROS:  Out of a complete 14 system review of symptoms, the patient complains only of the following symptoms, and all other reviewed systems are negative.  Runny nose Eye itching Cough, wheezing Environmental allergies Frequency of urination   Blood pressure 128/86, pulse 72, height 5\' 4"  (1.626 m), weight 227 lb (103 kg).  Physical Exam  General: The patient is alert and cooperative at the time of the examination.The patient is moderately to markedly obese.  Skin: No significant peripheral edema is noted.   Neurologic Exam  Mental status: The patient is alert and oriented x 3 at the time of the examination. The patient has apparent normal recent and remote memory, with an apparently normal attention span and concentration ability.   Cranial nerves: Facial symmetry is present. Speech is normal, no aphasia or dysarthria is noted. Extraocular movements are full. Visual fields are full.  Motor: The patient has good strength in all 4 extremities.  Sensory examination: Soft touch sensation is symmetric on the face, arms, and legs.  Coordination: The patient has good finger-nose-finger and  heel-to-shin bilaterally.  Gait and station: The patient has a normal gait. Tandem gait is normal. Romberg is negative. No drift is seen.  Reflexes: Deep tendon reflexes are symmetric.   Assessment/Plan:  1. Migraine headaches   2. History of seizures   The patient claims that she has not had a recurrent seizure off of seizure medications over the last 3 years. The patient is having occasional headaches, Maxalt is effective, she will be given a prescription for the Maxalt, she will follow-up in one year, sooner if needed.   Jill Alexanders MD 07/10/2017 12:28 PM  [facility] [address] [place], [place] [address]  Phone [phone] Fax [phone]

## 2017-11-26 DIAGNOSIS — R1013 Epigastric pain: Secondary | ICD-10-CM | POA: Insufficient documentation

## 2017-11-26 DIAGNOSIS — K219 Gastro-esophageal reflux disease without esophagitis: Secondary | ICD-10-CM | POA: Insufficient documentation

## 2017-11-26 DIAGNOSIS — E6609 Other obesity due to excess calories: Secondary | ICD-10-CM | POA: Insufficient documentation

## 2018-05-20 ENCOUNTER — Other Ambulatory Visit: Payer: Self-pay | Admitting: Family Medicine

## 2018-05-20 ENCOUNTER — Ambulatory Visit
Admission: RE | Admit: 2018-05-20 | Discharge: 2018-05-20 | Disposition: A | Discharge status: Home / Self Care | Payer: Self-pay | Source: Ambulatory Visit | Attending: Family Medicine | Admitting: Family Medicine

## 2018-05-20 DIAGNOSIS — T1490XA Injury, unspecified, initial encounter: Secondary | ICD-10-CM

## 2018-07-11 ENCOUNTER — Ambulatory Visit: Payer: 59 | Admitting: Adult Health

## 2018-07-18 ENCOUNTER — Encounter: Payer: Self-pay | Admitting: Skilled Nursing Facility1

## 2018-07-18 ENCOUNTER — Encounter: Payer: Self-pay | Attending: Family Medicine | Admitting: Skilled Nursing Facility1

## 2018-07-18 DIAGNOSIS — Z713 Dietary counseling and surveillance: Secondary | ICD-10-CM | POA: Insufficient documentation

## 2018-07-18 DIAGNOSIS — K219 Gastro-esophageal reflux disease without esophagitis: Secondary | ICD-10-CM | POA: Insufficient documentation

## 2018-07-18 DIAGNOSIS — E669 Obesity, unspecified: Secondary | ICD-10-CM | POA: Insufficient documentation

## 2018-07-18 NOTE — Progress Notes (Signed)
  Assessment:  Primary concerns today: GERD/weight management.   Pt states her husband has diabetes and she is worried about his levels. Pt state she has watched the [place] workout show that has given her good ideas. Pt states she has a garden with a lot of variety. Pt states her GERD was okay until the last couple days, stopped drinking milk, stopped drinking soda, tea instead of coffee, . Pt states she currently has a sinus congestion. Pt states she has less reflux with green tea. Pt states she has not been able to sleep well due to her infection. Pt states she wants to get back into the gym soon. Pt states she has had breast cancer 2 times. Pt states she tries to do a little something extra each day for activity.  Pt states when she fries chicken it is less often and with whole wheat flour. Pt states she keeps candy out of reach and harder to get to and keeps fresh fruit on the table to choose fruit over candy. Pt states since making all of these changes she has needed to buy new clothes. Pt keeps a calender of her workouts with fun stickers. Pt realized when she stresses she eats so she tries to go to the gym instead. Pt states she activly tries to not to over snack by going for a walk or drinking tea or reading.  Pt is talkative.    TANITA  BODY COMP RESULTS  07/18/2018   BMI (kg/m^2) 38   Fat Mass (lbs) 106.6   Fat Free Mass (lbs) 107.8   Total Body Water (lbs) 78     MEDICATIONS: See List   DIETARY INTAKE:  Usual eating pattern includes 3 meals and 3 snacks per day.  Everyday foods include none stated.  Avoided foods include none  stated.    24-hr recall: 7 days a week 3 meals; 3 reces every 3 days B ( AM): with handful of walnuts start out with water and coffee with half and half with peanut butter and banana sandwich with wheat bread and cinnamon or spinach and onion with soft egg and tomato or zucchini patties with egg and cornmeal and onion or fried green tomatoes  Snk ( AM):  fruit L ( PM): leftovers or salad: lettuce, onion, carrots, radish with spices with chicken Snk ( PM): chocolate  D ( PM): chinese food Snk ( PM): dark chocolate  Beverages: 3 cups of cofee, tea with honey and cinnamon, la croix 1 every couple days, herbal tea, warm water with lemons and oranges and cinnamon, 3 bottles of water    Usual physical activity: walking her dog inconsistently    Estimated energy needs: 1400 calories 158 g carbohydrates 105 g protein 39 g fat  Progress Towards Goal(s):  In progress.    Intervention:  Nutrition counseling for weight management. Goals: -Do not cook with butter  -Cut all your portions in half -Use your palm for meats and your fist for carbohydrate portions -Log everything you eat and drink  Teaching Method Utilized:  Visual Auditory Hands on  Barriers to learning/adherence to lifestyle change: none identified   Demonstrated degree of understanding via:  Teach Back   Monitoring/Evaluation:  Dietary intake, exercise, and body weight prn.

## 2018-09-12 ENCOUNTER — Ambulatory Visit: Payer: Self-pay | Admitting: Skilled Nursing Facility1

## 2018-09-30 ENCOUNTER — Ambulatory Visit: Payer: 59 | Admitting: Neurology

## 2018-10-29 ENCOUNTER — Other Ambulatory Visit: Payer: Self-pay | Admitting: Family Medicine

## 2018-10-29 DIAGNOSIS — Z853 Personal history of malignant neoplasm of breast: Secondary | ICD-10-CM

## 2018-10-29 DIAGNOSIS — Z1231 Encounter for screening mammogram for malignant neoplasm of breast: Secondary | ICD-10-CM

## 2018-11-06 ENCOUNTER — Ambulatory Visit
Admission: RE | Admit: 2018-11-06 | Discharge: 2018-11-06 | Disposition: A | Discharge status: Home / Self Care | Payer: 59 | Source: Ambulatory Visit | Attending: Family Medicine | Admitting: Family Medicine

## 2018-11-06 DIAGNOSIS — Z853 Personal history of malignant neoplasm of breast: Secondary | ICD-10-CM

## 2018-11-06 HISTORY — DX: Personal history of irradiation: Z92.3

## 2018-11-06 HISTORY — DX: Personal history of antineoplastic chemotherapy: Z92.21

## 2019-03-24 ENCOUNTER — Ambulatory Visit: Payer: 59 | Admitting: Adult Health

## 2019-03-24 ENCOUNTER — Encounter

## 2019-04-15 ENCOUNTER — Telehealth: Payer: Self-pay | Admitting: Neurology

## 2019-04-15 NOTE — Telephone Encounter (Signed)
Due to current COVID 19 pandemic, our office is severely reducing in office visits until further notice, in order to minimize the risk to our patients and healthcare providers.   Called patient and confirmed a virtual visit for her 6/1 appointment. Patient verbalized understanding of the doxy.me process and I have sent her an e-mail with link and directions as well as my name and office number/hours for reference. Patient understands that she will receive a call from RN to update chart.  Pt understands that although there may be some limitations with this type of visit, we will take all precautions to reduce any security or privacy concerns.  Pt understands that this will be treated like an in office visit and we will file with pt's insurance, and there may be a patient responsible charge related to this service.

## 2019-04-17 NOTE — Telephone Encounter (Signed)
I contacted the pt and left a vm so we could complete pre charting for 04/21/2019 virtual visit. Pt was provided with [facility] # and hours.

## 2019-04-21 ENCOUNTER — Other Ambulatory Visit: Payer: Self-pay

## 2019-04-21 ENCOUNTER — Encounter: Payer: Self-pay | Admitting: Neurology

## 2019-04-21 ENCOUNTER — Ambulatory Visit (INDEPENDENT_AMBULATORY_CARE_PROVIDER_SITE_OTHER): Payer: 59 | Admitting: Neurology

## 2019-04-21 DIAGNOSIS — G43009 Migraine without aura, not intractable, without status migrainosus: Secondary | ICD-10-CM | POA: Diagnosis not present

## 2019-04-21 MED ORDER — RIZATRIPTAN BENZOATE 10 MG PO TABS: ORAL_TABLET | ORAL | 5 refills | Status: DC

## 2019-04-21 NOTE — Progress Notes (Signed)
      Virtual Visit via Video Note  I connected with [place] on 04/21/19 at 10:00 AM EDT by a video enabled telemedicine application and verified that I am speaking with the correct person using two identifiers.  Location: Patient: The patient is at home. Provider: Physician in office.   I discussed the limitations of evaluation and management by telemedicine and the availability of in person appointments. The patient expressed understanding and agreed to proceed.  History of Present Illness: Debbie Peck is a 58 year old left-handed white female with a history of seizures, currently off all anticonvulsant medications, she has not had a seizure in greater than 5 years.  She is able to operate a motor vehicle without difficulty.  She does have a history of migraine headaches, she has had increased frequency of headaches as she is under quite a bit of stress currently.  She claims that her husband has recently had a heart attack, he had four-vessel coronary artery disease.  She is having to stay with him and care for him more, she is not able to get out and exercise as much.  She has gone from having 1 migraine every several months to having about 1 headache a week.  She will take Excedrin Migraine for the headache, when the headache is severe she may take Maxalt.  This helps her headache within 2 hours.  She does have some nausea with a headache.  She believes that the pollen also worsens her headache frequency.  She has episodes of left leg numbness that may occur at nighttime, this has been going on at least a year.  This is an occasional problem, not persistent.  Being able to exercise regularly seem to help this.   Observations/Objective: The video evaluation reveals that the patient is alert and cooperative.  She has good facial symmetry, she is able to protrude the tongue in the midline with good lateral tongue movement.  Extraoculars are full.  Speech is well enunciated, not aphasic or  dysarthric.  She has good finger-nose-finger and heel shin bilaterally.  Gait is normal.  Tandem gait is normal.  Romberg is negative.  Assessment and Plan: 1.  History of seizures, currently off medications  2.  History of migraine headache  The patient is had some increase in migraine associated with stress.  The patient will continue the Maxalt, I will call in a prescription.  She will call if the headaches become more severe, we may consider daily prophylactic medication at that time.  She will otherwise follow-up in 1 year.  Follow Up Instructions: 1 year follow-up, may see nurse practitioner.   I discussed the assessment and treatment plan with the patient. The patient was provided an opportunity to ask questions and all were answered. The patient agreed with the plan and demonstrated an understanding of the instructions.   The patient was advised to call back or seek an in-person evaluation if the symptoms worsen or if the condition fails to improve as anticipated.  I provided 25 minutes of non-face-to-face time during this encounter.   Kathrynn Ducking, MD

## 2019-04-23 ENCOUNTER — Telehealth: Payer: Self-pay

## 2019-04-23 NOTE — Telephone Encounter (Signed)
1 year f/u has been scheduled with SS NP. Appt date 04/26/20.

## 2019-05-10 IMAGING — MG DIGITAL DIAGNOSTIC BILATERAL MAMMOGRAM WITH TOMO AND CAD
8 series · 8 of 20 positions shown · non-contrast
Comparison: Previous exam(s).

CLINICAL DATA: 57-year-old female presenting for annual bilateral
mammogram. Patient had history of remote malignant right lumpectomy
with chemotherapy and radiation as well as a malignant left
lumpectomy with radiation in 5771. She has no current symptoms.

EXAM:
DIGITAL DIAGNOSTIC BILATERAL MAMMOGRAM WITH CAD AND TOMO

[L CC (1 of 2)]
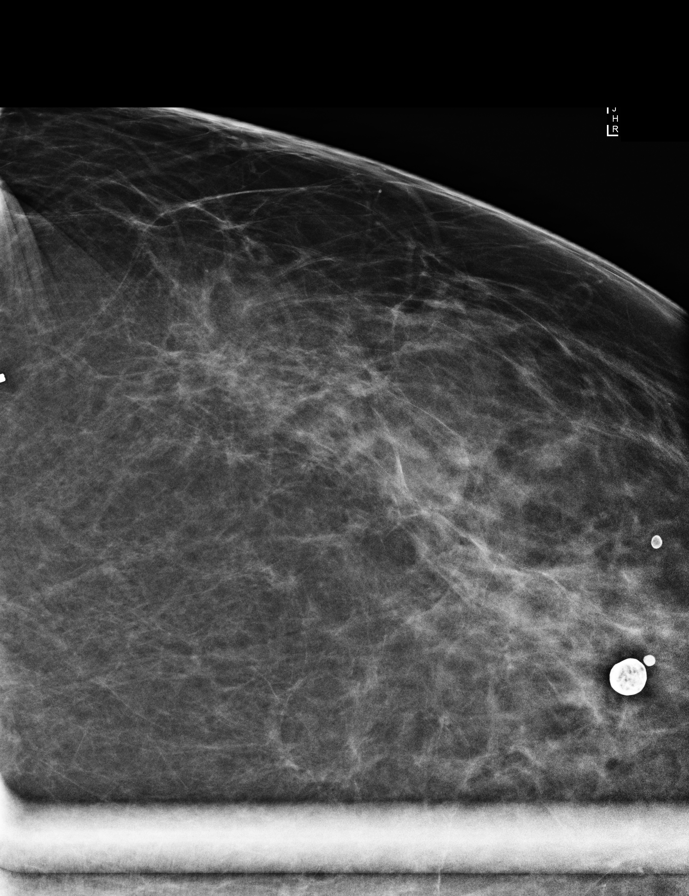

[L CC (2 of 2)]
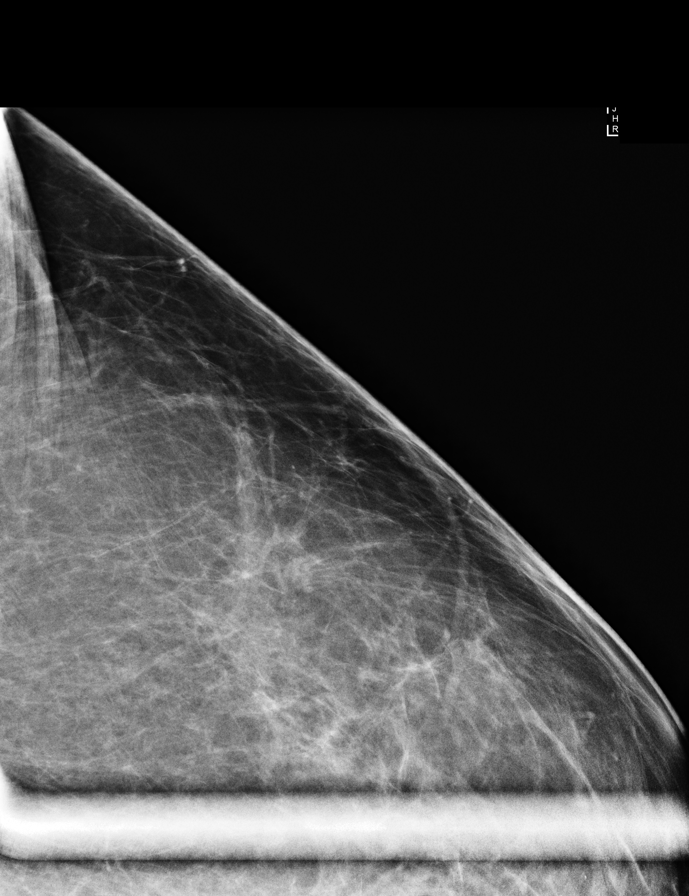

[L CC synth-2D]
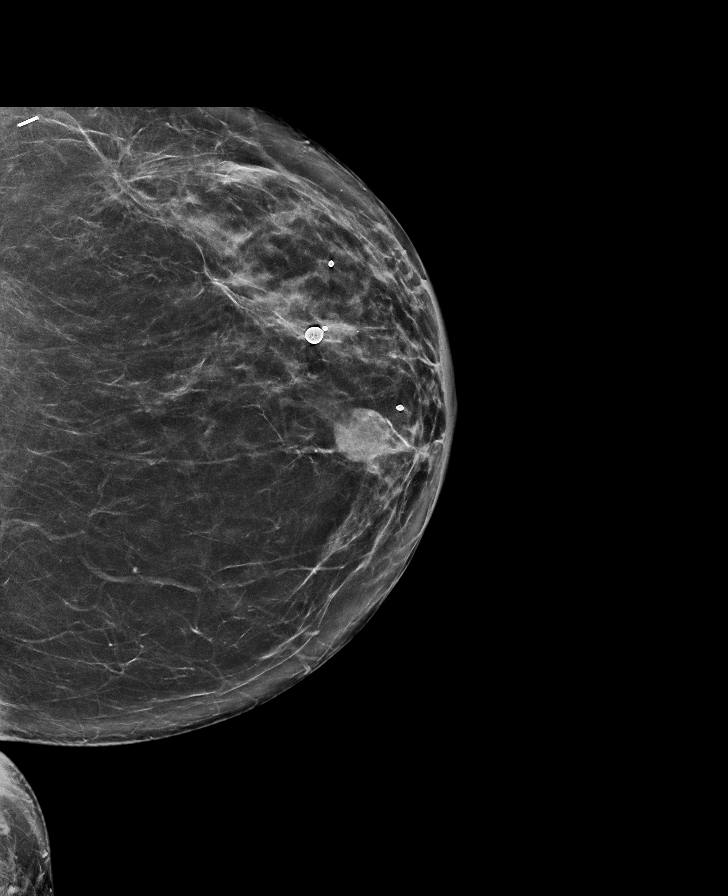

[L MLO synth-2D]
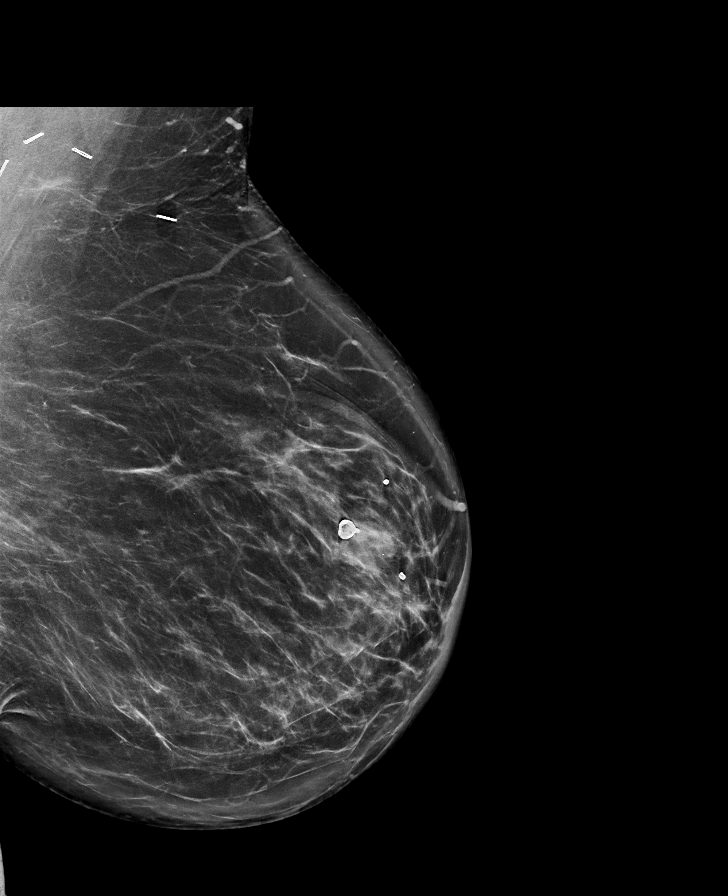

[R CC synth-2D]
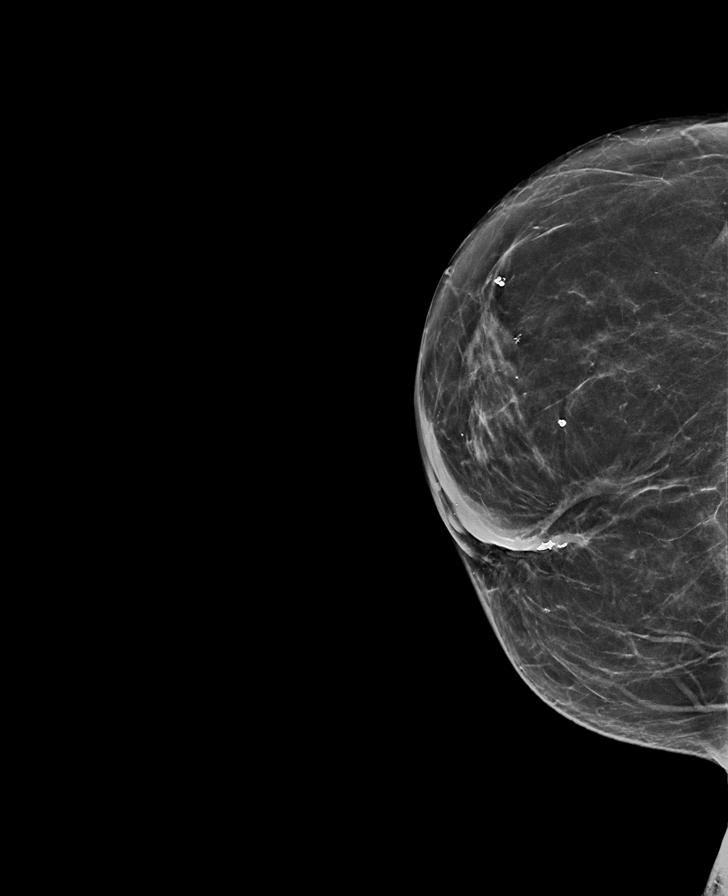

[L MLO tomo · tomo slice 45/90.0]
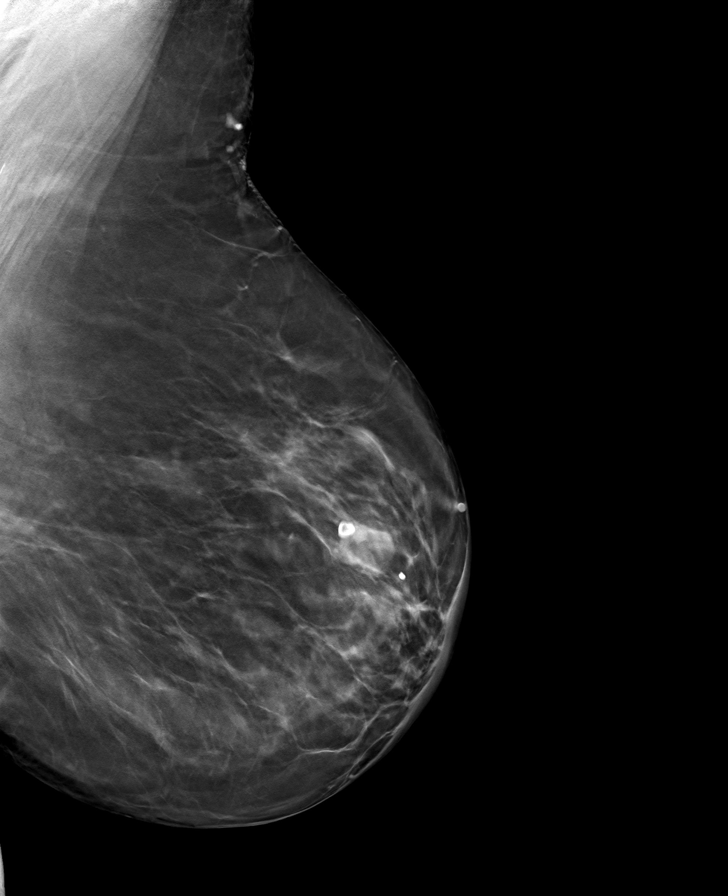

[L CC tomo · tomo slice 40/79.0]
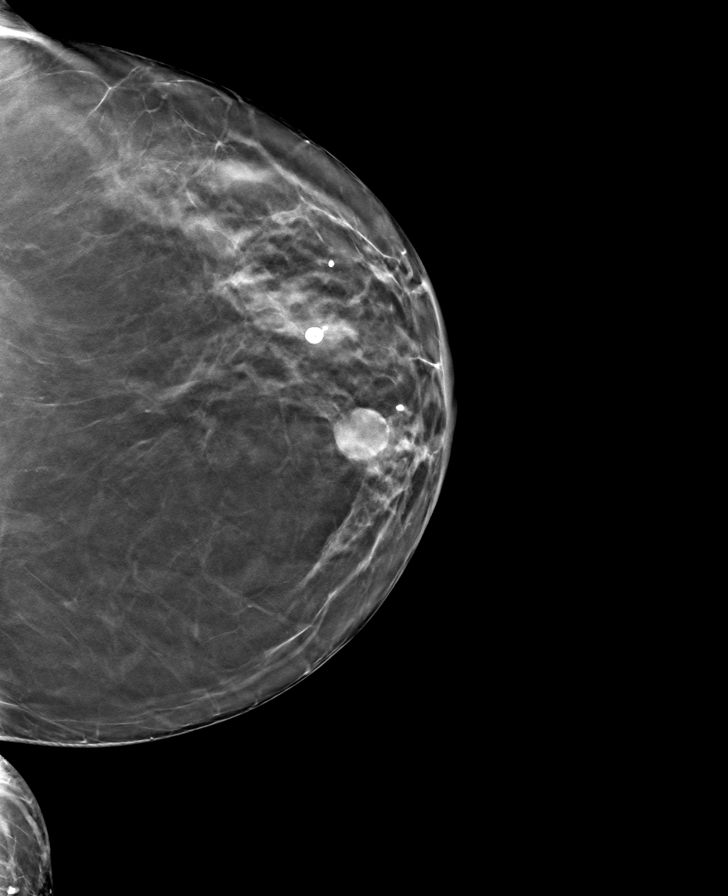

[R CC tomo · tomo slice 33/64.0]
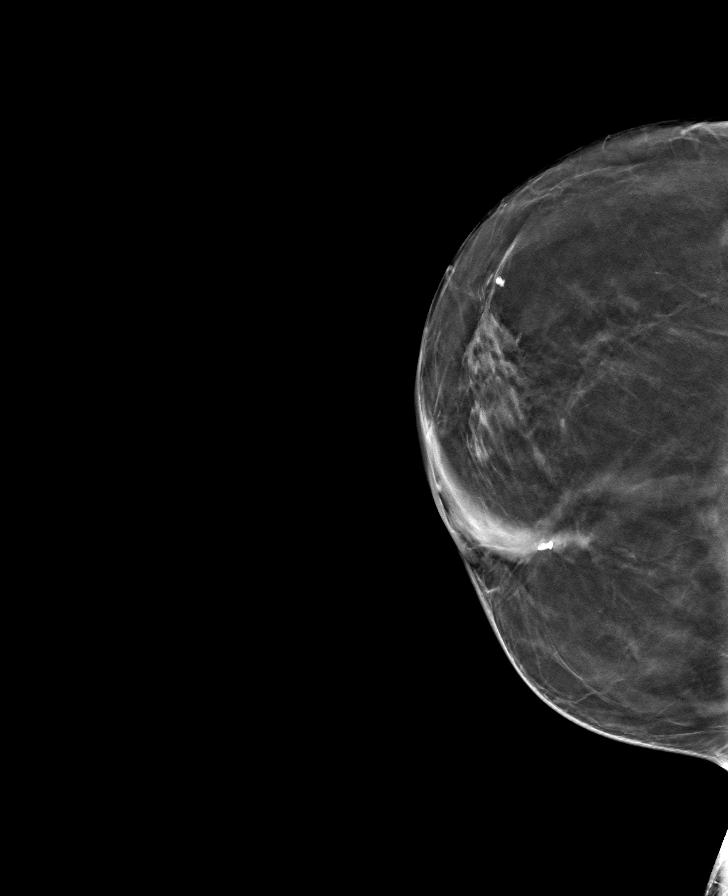

[8 of 20 positions shown; findings below may reference images not displayed]

ACR Breast Density Category b: There are scattered areas of
fibroglandular density.
FINDINGS: Stable posttreatment changes are noted in the bilateral breasts. No
new or suspicious findings are identified in either breast. The
parenchymal pattern is stable.

Mammographic images were processed with CAD.
IMPRESSION: Stable, bilateral posttreatment changes without mammographic
evidence of malignancy.

RECOMMENDATION:
Screening mammogram in one year.(Code:[id number])

I have discussed the findings and recommendations with the patient.
Results were also provided in writing at the conclusion of the
visit. If applicable, a reminder letter will be sent to the patient
regarding the next appointment.

BI-RADS CATEGORY  2: Benign.

## 2020-02-15 ENCOUNTER — Ambulatory Visit: Payer: 59 | Attending: Internal Medicine

## 2020-02-15 DIAGNOSIS — Z23 Encounter for immunization: Secondary | ICD-10-CM

## 2020-02-15 NOTE — Progress Notes (Signed)
   Covid-19 Vaccination Clinic  Name:  Roshundra Madara    MRN: [id number] DOB: 1961-01-29  02/15/2020  Ms. Pruette was observed post Covid-19 immunization for 15 minutes without incident. She was provided with Vaccine Information Sheet and instruction to access the V-Safe system.   Ms. Dietzen was instructed to call 911 with any severe reactions post vaccine: Marland Kitchen Difficulty breathing  . Swelling of face and throat  . A fast heartbeat  . A bad rash all over body  . Dizziness and weakness   Immunizations Administered    Name Date Dose VIS Date Route   Pfizer COVID-19 Vaccine 02/15/2020  9:03 AM 0.3 mL 10/31/2019 Intramuscular   Manufacturer: [place]   Lot: [id number]   [place]: [id number]

## 2020-03-09 ENCOUNTER — Ambulatory Visit: Payer: 59 | Attending: Internal Medicine

## 2020-03-09 DIAGNOSIS — Z23 Encounter for immunization: Secondary | ICD-10-CM

## 2020-03-09 NOTE — Progress Notes (Signed)
   Covid-19 Vaccination Clinic  Name:  Debbie Peck    MRN: [id number] DOB: [birth year]  03/09/2020  [name] was observed post Covid-19 immunization for 15 minutes without incident. She was provided with Vaccine Information Sheet and instruction to access the V-Safe system.   [name] was instructed to call 911 with any severe reactions post vaccine: Marland Kitchen Difficulty breathing  . Swelling of face and throat  . A fast heartbeat  . A bad rash all over body  . Dizziness and weakness   Immunizations Administered    Name Date Dose VIS Date Route   Pfizer COVID-19 Vaccine 03/09/2020  1:47 PM 0.3 mL 01/14/2019 Intramuscular   Manufacturer: [place]   Lot: [id number]   [place]: [id number]

## 2020-04-26 ENCOUNTER — Ambulatory Visit: Payer: BC Managed Care – PPO | Admitting: Neurology

## 2020-04-26 ENCOUNTER — Other Ambulatory Visit: Payer: Self-pay

## 2020-04-26 ENCOUNTER — Encounter: Payer: Self-pay | Admitting: Neurology

## 2020-04-26 VITALS — BP 137/90 | HR 84 | Ht 63.0 in | Wt 233.0 lb

## 2020-04-26 DIAGNOSIS — G43009 Migraine without aura, not intractable, without status migrainosus: Secondary | ICD-10-CM | POA: Diagnosis not present

## 2020-04-26 DIAGNOSIS — G40219 Localization-related (focal) (partial) symptomatic epilepsy and epileptic syndromes with complex partial seizures, intractable, without status epilepticus: Secondary | ICD-10-CM | POA: Diagnosis not present

## 2020-04-26 MED ORDER — RIZATRIPTAN BENZOATE 10 MG PO TABS: ORAL_TABLET | ORAL | 5 refills | Status: DC

## 2020-04-26 MED ORDER — GABAPENTIN 300 MG PO CAPS: 300.0000 mg | ORAL_CAPSULE | Freq: Every day | ORAL | 3 refills | Status: DC

## 2020-04-26 NOTE — Progress Notes (Signed)
PATIENT: Debbie Peck DOB: [birth year]  REASON FOR VISIT: follow up HISTORY FROM: patient  HISTORY OF PRESENT ILLNESS: Today 04/26/20  Debbie Peck is a 59 year old female with history of seizures and migraine headaches.  She is not on any medication for either condition.  She takes Maxalt if needed.  Today, indicates for the last 2 weeks, has had morning headache.  Describes as pressure to the top of her head, feels some sort of migraine variant, because feels tired.  Has not taken any medication for it, will drink water, cup of coffee.  After a few hours it will go away.  Her headaches are typically triggered by allergies and pollen.  She has had increased stress recently, her mother had a stroke, is currently having a procedure in the hospital today. In the morning, may feel her balance isn't great.  Has not had recurrent seizure in several years.  Indicates, she sleeps well, unclear if she snores, feels rested throughout the day. Has not taken Maxalt for these headaches, due to haven not eaten when they occur.  Presents today for evaluation unaccompanied.  HISTORY 04/21/2019 Dr. Jannifer Peck: Debbie Peck is a 59 year old left-handed white female with a history of seizures, currently off all anticonvulsant medications, she has not had a seizure in greater than 5 years.  She is able to operate a motor vehicle without difficulty.  She does have a history of migraine headaches, she has had increased frequency of headaches as she is under quite a bit of stress currently.  She claims that her husband has recently had a heart attack, he had four-vessel coronary artery disease.  She is having to stay with him and care for him more, she is not able to get out and exercise as much.  She has gone from having 1 migraine every several months to having about 1 headache a week.  She will take Excedrin Migraine for the headache, when the headache is severe she may take Maxalt.  This helps her headache within 2 hours.  She  does have some nausea with a headache.  She believes that the pollen also worsens her headache frequency.  She has episodes of left leg numbness that may occur at nighttime, this has been going on at least a year.  This is an occasional problem, not persistent.  Being able to exercise regularly seem to help this.   REVIEW OF SYSTEMS: Out of a complete 14 system review of symptoms, the patient complains only of the following symptoms, and all other reviewed systems are negative.  Headache  ALLERGIES: Allergies  Allergen Reactions   Sulfa Drugs Cross Reactors     HOME MEDICATIONS: Outpatient Medications Prior to Visit  Medication Sig Dispense Refill   albuterol (PROAIR HFA) 108 (90 Base) MCG/ACT inhaler Inhale 1 puff into the lungs as needed.      aspirin-acetaminophen-caffeine (EXCEDRIN MIGRAINE) 250-250-65 MG tablet Take 1 tablet by mouth every 6 (six) hours as needed for headache.     fluticasone (FLONASE) 50 MCG/ACT nasal spray Place 1 spray into both nostrils daily.     UNKNOWN TO PATIENT Take 1 Dose by mouth 2 (two) times daily.     rizatriptan (MAXALT) 10 MG tablet Take 1 tablet at the onset of migraine. May repeat in 2 hours if needed. Do not exceed 2 tablets in 24 hours. 10 tablet 5   No facility-administered medications prior to visit.    PAST MEDICAL HISTORY: Past Medical History:  Diagnosis Date  Asthma    Breast cancer ([place])    Cerebral palsy ([facility])    Degenerative arthritis    Depression with anxiety    Gastroesophageal reflux disease    Localization-related (focal) (partial) epilepsy and epileptic syndromes with complex partial seizures, without mention of intractable epilepsy    Migraine without aura, without mention of intractable migraine without mention of status migrainosus    Obesity    Personal history of chemotherapy    Personal history of radiation therapy    Sleep apnea    father    PAST SURGICAL HISTORY: Past Surgical History:    Procedure Laterality Date   BREAST LUMPECTOMY Right 1998   BREAST LUMPECTOMY Left 2008   FOOT CAPSULE RELEASE W/ PERCUTANEOUS HEEL CORD LENGTHENING, TIBIAL TENDON TRANSFER Right    HERNIA REPAIR     OOPHORECTOMY Bilateral    ovaries remove      FAMILY HISTORY: Family History  Problem Relation Age of Onset   Seizures Mother    Stroke Mother    Hypertension Father    Diabetes Father    Obesity Sister     SOCIAL HISTORY: Social History   Socioeconomic History   Marital status: Married    Spouse name: Remo Lipps    Number of children: 0   Years of education: HS   Highest education level: Not on file  Occupational History   Occupation: Disabled   Tobacco Use   Smoking status: Never Smoker   Smokeless tobacco: Never Used  Substance and Sexual Activity   Alcohol use: No    Alcohol/week: 0.0 standard drinks   Drug use: No   Sexual activity: Yes  Other Topics Concern   Not on file  Social History Narrative   Patient is married to [place] and lives with him and roommate.    Patient has no children.    Patient is on disability.    Patient is left handed.    Patient has some community college.    Social Determinants of Health   Financial Resource Strain:    Difficulty of Paying Living Expenses:   Food Insecurity:    Worried About Charity fundraiser in the Last Year:    Arboriculturist in the Last Year:   Transportation Needs:    Film/video editor (Medical):    Lack of Transportation (Non-Medical):   Physical Activity:    Days of Exercise per Week:    Minutes of Exercise per Session:   Stress:    Feeling of Stress :   Social Connections:    Frequency of Communication with Friends and Family:    Frequency of Social Gatherings with Friends and Family:    Attends Religious Services:    Active Member of Clubs or Organizations:    Attends Archivist Meetings:    Marital Status:   Intimate Partner Violence:    Fear  of Current or Ex-Partner:    Emotionally Abused:    Physically Abused:    Sexually Abused:    PHYSICAL EXAM  Vitals:   04/26/20 1028  BP: 137/90  Pulse: 84  Weight: 233 lb (105.7 kg)  Height: '5\' 3"'$  (1.6 m)   Body mass index is 41.27 kg/m.  Generalized: Well developed, in no acute distress   Neurological examination  Mentation: Alert oriented to time, place, history taking. Follows all commands speech and language fluent Cranial nerve II-XII: Pupils were equal round reactive to light. Extraocular movements were full, visual field were full on  confrontational test. Facial sensation and strength were normal.  Head turning and shoulder shrug  were normal and symmetric. Motor: The motor testing reveals 5 over 5 strength of all 4 extremities. Good symmetric motor tone is noted throughout.  Sensory: Sensory testing is intact to soft touch on all 4 extremities. No evidence of extinction is noted.  Coordination: Cerebellar testing reveals good finger-nose-finger and heel-to-shin bilaterally.  Gait and station: Gait is normal. Tandem gait is slightly unsteady. Romberg is negative. No drift is seen.  Reflexes: Deep tendon reflexes are symmetric and normal bilaterally.   DIAGNOSTIC DATA (LABS, IMAGING, TESTING) - I reviewed patient records, labs, notes, testing and imaging myself where available.  Lab Results  Component Value Date   WBC 6.4 03/08/2011   HGB 13.0 03/08/2011   HCT 37.7 03/08/2011   MCV 91.4 03/08/2011   PLT 263 03/08/2011      Component Value Date/Time   NA 139 03/08/2011 1525   K 3.7 03/08/2011 1525   CL 108 03/08/2011 1525   CO2 24 03/08/2011 1525   GLUCOSE 99 03/08/2011 1525   BUN 7 03/08/2011 1525   CREATININE 0.50 03/08/2011 1525   CALCIUM 9.0 03/08/2011 1525   PROT 6.7 03/08/2011 1525   ALBUMIN 3.7 03/08/2011 1525   AST 24 03/08/2011 1525   ALT 24 03/08/2011 1525   ALKPHOS 51 03/08/2011 1525   BILITOT 0.7 03/08/2011 1525   GFRNONAA >60 04/24/2007  0934   GFRAA  04/24/2007 0934    >60        The eGFR has been calculated using the MDRD equation. This calculation has not been validated in all clinical   No results found for: CHOL, HDL, LDLCALC, LDLDIRECT, TRIG, CHOLHDL No results found for: HGBA1C No results found for: VITAMINB12 No results found for: TSH    ASSESSMENT AND PLAN 59 y.o. year old female  has a past medical history of Asthma, Breast cancer ([place]), Cerebral palsy ([place]), Degenerative arthritis, Depression with anxiety, Gastroesophageal reflux disease, Localization-related (focal) (partial) epilepsy and epileptic syndromes with complex partial seizures, without mention of intractable epilepsy, Migraine without aura, without mention of intractable migraine without mention of status migrainosus, Obesity, Personal history of chemotherapy, Personal history of radiation therapy, and Sleep apnea. here with:  1.  Migraine headaches 2.  History of seizures  Patient endorses a 2-week period of daily morning headache, felt to be some sort of migraine variant, has fatigue with episodes.  Denies snoring, daytime fatigue or drowsiness.  She is however obese.  Given the increased frequency and headache, we will treat the headache with preventative medications.  She will try gabapentin 300 mg at bedtime.  Didn't consider propanolol due to history of asthma, could try Topamax but patient concerned history of sulfa allergy (didn't find any contraindication for this).  She will remain on Maxalt as needed.  She will follow-up at 3 months or sooner if needed, she will call for dose adjustment, will continue to consider OSA as possibility.  I spent 20 minutes of face-to-face and non-face-to-face time with patient.  This included previsit chart review, lab review, study review, order entry, electronic health record documentation, patient education.  Butler Denmark, AGNP-C, DNP 04/26/2020, 11:24 AM [facility] [address] [place], [place] [address] [phone]

## 2020-04-26 NOTE — Patient Instructions (Addendum)
Try gabapentin at bedtime 300 mg for headache  Take Maxalt is needed Ask your husband to pay attention to any snoring? See you back in 3 months

## 2020-04-26 NOTE — Progress Notes (Signed)
I have read the note, and I agree with the clinical assessment and plan.  Lyliana Dicenso K Amalea Ottey   

## 2020-04-28 ENCOUNTER — Other Ambulatory Visit: Payer: Self-pay

## 2020-04-28 ENCOUNTER — Ambulatory Visit: Payer: BC Managed Care – PPO | Admitting: Physician Assistant

## 2020-04-28 VITALS — BP 122/83 | HR 83 | Temp 98.7°F | Resp 18 | Ht 64.0 in | Wt 231.0 lb

## 2020-04-28 DIAGNOSIS — R030 Elevated blood-pressure reading, without diagnosis of hypertension: Secondary | ICD-10-CM | POA: Diagnosis not present

## 2020-04-28 DIAGNOSIS — E6609 Other obesity due to excess calories: Secondary | ICD-10-CM | POA: Diagnosis not present

## 2020-04-28 DIAGNOSIS — Z713 Dietary counseling and surveillance: Secondary | ICD-10-CM | POA: Diagnosis not present

## 2020-04-28 DIAGNOSIS — Z6839 Body mass index (BMI) 39.0-39.9, adult: Secondary | ICD-10-CM | POA: Diagnosis not present

## 2020-04-28 NOTE — Progress Notes (Signed)
New Patient Office Visit  Subjective:  Patient ID: Debbie Peck, female    DOB: 16-Nov-1961  Age: 59 y.o. MRN: [id number]  CC:  Chief Complaint  Patient presents with   Blood Pressure Check    HPI [place] reports that she was seen by neurology yesterday, was told that her blood pressure was elevated.  Reports that she has never been diagnosed with hypertension, does not check blood pressure at home.  Denies any hypertensive symptoms.  Reports that prior to Covid she was working on improving her diet and exercising at the [facility].  Reports that she did regain her weight during Covid and would like to restart her weight loss efforts.  Reports that she has previously seen a nutritionist and was told that her nutrition was good.  Reports she drinks 3-4 bottles of water a day, sleeps 7 hours most nights but does endorse that she has a hard time falling asleep.  Reports that she has increased stressors, husband has several chronic medical conditions, mother is currently hospitalized.  Past Medical History:  Diagnosis Date   Asthma    Breast cancer ([place])    Cerebral palsy ([facility])    Degenerative arthritis    Depression with anxiety    Gastroesophageal reflux disease    Localization-related (focal) (partial) epilepsy and epileptic syndromes with complex partial seizures, without mention of intractable epilepsy    Migraine without aura, without mention of intractable migraine without mention of status migrainosus    Obesity    Personal history of chemotherapy    Personal history of radiation therapy    Sleep apnea    father    Past Surgical History:  Procedure Laterality Date   BREAST LUMPECTOMY Right 1998   BREAST LUMPECTOMY Left 2008   FOOT CAPSULE RELEASE W/ PERCUTANEOUS HEEL CORD LENGTHENING, TIBIAL TENDON TRANSFER Right    HERNIA REPAIR     OOPHORECTOMY Bilateral    ovaries remove      Family History  Problem Relation Age of Onset   Seizures Mother     Stroke Mother    Hypertension Father    Diabetes Father    Obesity Sister     Social History   Socioeconomic History   Marital status: Married    Spouse name: Remo Lipps    Number of children: 0   Years of education: HS   Highest education level: Not on file  Occupational History   Occupation: Disabled   Tobacco Use   Smoking status: Never Smoker   Smokeless tobacco: Never Used  Substance and Sexual Activity   Alcohol use: No    Alcohol/week: 0.0 standard drinks   Drug use: No   Sexual activity: Yes  Other Topics Concern   Not on file  Social History Narrative   Patient is married to [place] and lives with him and roommate.    Patient has no children.    Patient is on disability.    Patient is left handed.    Patient has some community college.    Social Determinants of Health   Financial Resource Strain:    Difficulty of Paying Living Expenses:   Food Insecurity:    Worried About Charity fundraiser in the Last Year:    Arboriculturist in the Last Year:   Transportation Needs:    Film/video editor (Medical):    Lack of Transportation (Non-Medical):   Physical Activity:    Days of Exercise per Week:    Minutes  of Exercise per Session:   Stress:    Feeling of Stress :   Social Connections:    Frequency of Communication with Friends and Family:    Frequency of Social Gatherings with Friends and Family:    Attends Religious Services:    Active Member of Clubs or Organizations:    Attends Music therapist:    Marital Status:   Intimate Partner Violence:    Fear of Current or Ex-Partner:    Emotionally Abused:    Physically Abused:    Sexually Abused:     ROS Review of Systems  Constitutional: Negative.   HENT: Negative.   Eyes: Negative.   Respiratory: Negative.   Cardiovascular: Negative.   Gastrointestinal: Negative.   Endocrine: Negative.   Genitourinary: Negative.   Musculoskeletal: Negative.    Skin: Negative.   Allergic/Immunologic: Negative.   Neurological: Negative.   Hematological: Negative.   Psychiatric/Behavioral: Negative.     Objective:   Today's Vitals: BP 122/83 (BP Location: Left Arm, Patient Position: Sitting, Cuff Size: Large)    Pulse 83    Temp 98.7 F (37.1 C) (Oral)    Resp 18    Ht 5\' 4"  (1.626 m)    Wt 231 lb (104.8 kg)    SpO2 98%    BMI 39.65 kg/m   Physical Exam Vitals and nursing note reviewed.  Constitutional:      Appearance: Normal appearance. She is obese.  HENT:     Head: Normocephalic and atraumatic.     Right Ear: External ear normal.     Left Ear: External ear normal.     Mouth/Throat:     Mouth: Mucous membranes are moist.     Pharynx: Oropharynx is clear.  Eyes:     Extraocular Movements: Extraocular movements intact.     Conjunctiva/sclera: Conjunctivae normal.     Pupils: Pupils are equal, round, and reactive to light.  Cardiovascular:     Rate and Rhythm: Normal rate and regular rhythm.  Pulmonary:     Effort: Pulmonary effort is normal.     Breath sounds: Normal breath sounds.  Abdominal:     General: Abdomen is flat. Bowel sounds are normal.     Palpations: Abdomen is soft.  Musculoskeletal:        General: Normal range of motion.     Cervical back: Normal range of motion and neck supple.  Skin:    General: Skin is warm and dry.  Neurological:     General: No focal deficit present.     Mental Status: She is alert and oriented to person, place, and time.  Psychiatric:        Mood and Affect: Mood normal.        Behavior: Behavior normal.        Thought Content: Thought content normal.        Judgment: Judgment normal.     Assessment & Plan:   Problem List Items Addressed This Visit    None    Visit Diagnoses    Elevated blood pressure reading in office without diagnosis of hypertension    -  Primary   Class 2 obesity due to excess calories with body mass index (BMI) of 39.0 to 39.9 in adult, unspecified  whether serious comorbidity present       Nutritional counseling          Outpatient Encounter Medications as of 04/28/2020  Medication Sig   albuterol (PROAIR HFA) 108 (90 Base) MCG/ACT  inhaler Inhale 1 puff into the lungs as needed.    aspirin-acetaminophen-caffeine (EXCEDRIN MIGRAINE) 250-250-65 MG tablet Take 1 tablet by mouth every 6 (six) hours as needed for headache.   fluticasone (FLONASE) 50 MCG/ACT nasal spray Place 1 spray into both nostrils daily.   gabapentin (NEURONTIN) 300 MG capsule Take 1 capsule (300 mg total) by mouth at bedtime.   rizatriptan (MAXALT) 10 MG tablet Take 1 tablet at the onset of migraine. May repeat in 2 hours if needed. Do not exceed 2 tablets in 24 hours.   UNKNOWN TO PATIENT Take 1 Dose by mouth 2 (two) times daily.   No facility-administered encounter medications on file as of 04/28/2020.  1. Elevated blood pressure reading in office without diagnosis of hypertension Encourage patient to check blood pressure on a daily basis, keep log of blood pressure readings, follow-up with primary care provider if readings continue to be elevated  2. Class 2 obesity due to excess calories with body mass index (BMI) of 39.0 to 39.9 in adult, unspecified whether serious comorbidity present Gave patient education on mindful eating, intuitive eating, improving sleep quality with trial of melatonin over-the-counter, drinking at least 64 ounces of water a day, increasing exercise for overall wellbeing  3. Nutritional counseling   I have reviewed the patient's medical history (PMH, PSH, Social History, Family History, Medications, and allergies) , and have been updated if relevant. I spent 30 minutes reviewing chart and  face to face time with patient.      Follow-up: Return if symptoms worsen or fail to improve.   Loraine Grip Mayers, PA-C

## 2020-04-28 NOTE — Patient Instructions (Addendum)
I recommend that you work on mindfulness around eating, ask yourself before eating "Am I hungry?.  As you are eating, eat without distraction, pay attention to feelings of satisfied versus feelings of wanting to keep eating because it tastes good or you do not want to waste food.  Intuitive eating is the term that I described, there is lots of good information online regarding this.  I strongly recommend that you drink at least 64 ounces of water a day and sleep at least 7 hours every night.  If you have difficulty falling asleep or staying asleep, you can do a trial of melatonin over-the-counter.    I recommend that you purchase a blood pressure cuff, check your blood pressure at home on a regular basis, keep a log of blood pressure readings, if you continue to have elevated readings over 130/80, please follow-up with your primary care provider or return to the mobile unit.  Thank you for trusting [place] with your care  Kennieth Rad, PA-C Physician Assistant [place] [email or web address]  Preventing Hypertension Hypertension, commonly called high blood pressure, is when the force of blood pumping through the arteries is too strong. Arteries are blood vessels that carry blood from the heart throughout the body. Over time, hypertension can damage the arteries and decrease blood flow to important parts of the body, including the brain, heart, and kidneys. Often, hypertension does not cause symptoms until blood pressure is very high. For this reason, it is important to have your blood pressure checked on a regular basis. Hypertension can often be prevented with diet and lifestyle changes. If you already have hypertension, you can control it with diet and lifestyle changes, as well as medicine. What nutrition changes can be made? Maintain a healthy diet. This includes:  Eating less salt (sodium). Ask your health care provider how much sodium is  safe for you to have. The general recommendation is to consume less than 1 tsp (2,300 mg) of sodium a day. ? Do not add salt to your food. ? Choose low-sodium options when grocery shopping and eating out.  Limiting fats in your diet. You can do this by eating low-fat or fat-free dairy products and by eating less red meat.  Eating more fruits, vegetables, and whole grains. Make a goal to eat: ? 1-2 cups of fresh fruits and vegetables each day. ? 3-4 servings of whole grains each day.  Avoiding foods and beverages that have added sugars.  Eating fish that contain healthy fats (omega-3 fatty acids), such as mackerel or salmon. If you need help putting together a healthy eating plan, try the DASH diet. This diet is high in fruits, vegetables, and whole grains. It is low in sodium, red meat, and added sugars. DASH stands for Dietary Approaches to Stop Hypertension. What lifestyle changes can be made?   Lose weight if you are overweight. Losing just 3?5% of your body weight can help prevent or control hypertension. ? For example, if your present weight is 200 lb (91 kg), a loss of 3-5% of your weight means losing 6-10 lb (2.7-4.5 kg). ? Ask your health care provider to help you with a diet and exercise plan to safely lose weight.  Get enough exercise. Do at least 150 minutes of moderate-intensity exercise each week. ? You could do this in short exercise sessions several times a day, or you could do longer exercise sessions a few times a week. For example, you could take a brisk 10-minute walk or  bike ride, 3 times a day, for 5 days a week.  Find ways to reduce stress, such as exercising, meditating, listening to music, or taking a yoga class. If you need help reducing stress, ask your health care provider.  Do not smoke. This includes e-cigarettes. Chemicals in tobacco and nicotine products raise your blood pressure each time you smoke. If you need help quitting, ask your health care  provider.  Avoid alcohol. If you drink alcohol, limit alcohol intake to no more than 1 drink a day for nonpregnant women and 2 drinks a day for men. One drink equals 12 oz of beer, 5 oz of wine, or 1 oz of hard liquor. Why are these changes important? Diet and lifestyle changes can help you prevent hypertension, and they may make you feel better overall and improve your quality of life. If you have hypertension, making these changes will help you control it and help prevent major complications, such as:  Hardening and narrowing of arteries that supply blood to: ? Your heart. This can cause a heart attack. ? Your brain. This can cause a stroke. ? Your kidneys. This can cause kidney failure.  Stress on your heart muscle, which can cause heart failure. What can I do to lower my risk?  Work with your health care provider to make a hypertension prevention plan that works for you. Follow your plan and keep all follow-up visits as told by your health care provider.  Learn how to check your blood pressure at home. Make sure that you know your personal target blood pressure, as told by your health care provider. How is this treated? In addition to diet and lifestyle changes, your health care provider may recommend medicines to help lower your blood pressure. You may need to try a few different medicines to find what works best for you. You also may need to take more than one medicine. Take over-the-counter and prescription medicines only as told by your health care provider. Where to find support Your health care provider can help you prevent hypertension and help you keep your blood pressure at a healthy level. Your local hospital or your community may also provide support services and prevention programs. The American Heart Association offers an online support network at: CheapBootlegs.com.cy Where to find more information Learn more about hypertension  from:  [place], Lung, and Blood Institute: ElectronicHangman.is  Centers for Disease Control and Prevention: https://ingram.com/  American Academy of Family Physicians: http://familydoctor.org/familydoctor/en/diseases-conditions/high-blood-pressure.printerview.all.html Learn more about the DASH diet from:  [place], Lung, and [place]: https://www.reyes.com/ Contact a health care provider if:  You think you are having a reaction to medicines you have taken.  You have recurrent headaches or feel dizzy.  You have swelling in your ankles.  You have trouble with your vision. Summary  Hypertension often does not cause any symptoms until blood pressure is very high. It is important to get your blood pressure checked regularly.  Diet and lifestyle changes are the most important steps in preventing hypertension.  By keeping your blood pressure in a healthy range, you can prevent complications like heart attack, heart failure, stroke, and kidney failure.  Work with your health care provider to make a hypertension prevention plan that works for you. This information is not intended to replace advice given to you by your health care provider. Make sure you discuss any questions you have with your health care provider. Document Revised: 02/28/2019 Document Reviewed: 07/17/2016 Elsevier Patient Education  2020 [employer].

## 2020-08-03 ENCOUNTER — Encounter: Payer: Self-pay | Admitting: Neurology

## 2020-08-03 ENCOUNTER — Ambulatory Visit: Payer: BC Managed Care – PPO | Admitting: Neurology

## 2020-08-03 ENCOUNTER — Other Ambulatory Visit: Payer: Self-pay

## 2020-08-03 VITALS — BP 126/84 | HR 84 | Ht 64.0 in | Wt 235.6 lb

## 2020-08-03 DIAGNOSIS — G43009 Migraine without aura, not intractable, without status migrainosus: Secondary | ICD-10-CM

## 2020-08-03 DIAGNOSIS — G40219 Localization-related (focal) (partial) symptomatic epilepsy and epileptic syndromes with complex partial seizures, intractable, without status epilepticus: Secondary | ICD-10-CM | POA: Diagnosis not present

## 2020-08-03 MED ORDER — GABAPENTIN 300 MG PO CAPS: 300.0000 mg | ORAL_CAPSULE | Freq: Every day | ORAL | 5 refills | Status: DC

## 2020-08-03 NOTE — Progress Notes (Signed)
I have read the note, and I agree with the clinical assessment and plan.  Maximiliano Cromartie K Rockell Faulks   

## 2020-08-03 NOTE — Progress Notes (Signed)
PATIENT: Debbie Peck DOB: [birth year]  REASON FOR VISIT: follow up HISTORY FROM: patient  HISTORY OF PRESENT ILLNESS: Today 08/03/20 Debbie Peck is a 59 year old female history of seizures and migraine headaches.  When last seen, she had a reported increase in headache for 2 weeks straight.  She was placed on gabapentin, takes Maxalt as needed.  Taking gabapentin at bedtime, has been helpful for headache.  Continues to report on average 3-4 morning headaches, usually under located occipitally, spreads forward bilaterally, rarely takes Maxalt.  Will usually lie back down and go to sleep.  Unclear if she snores, her husband sleeps on the couch.  No significant fatigue or daytime drowsiness.  She does stay up late at night watching Netflix.  No seizures.  Does have chronic allergy issues.  Has noted over time, her balance is not as great. For last few years, headaches have become more morning focused. Presents today for evaluation unaccompanied.  HISTORY  04/26/2020 SS: Debbie Peck is a 59 year old female with history of seizures and migraine headaches.  She is not on any medication for either condition.  She takes Maxalt if needed.  Today, indicates for the last 2 weeks, has had morning headache.  Describes as pressure to the top of her head, feels some sort of migraine variant, because feels tired.  Has not taken any medication for it, will drink water, cup of coffee.  After a few hours it will go away.  Her headaches are typically triggered by allergies and pollen.  She has had increased stress recently, her mother had a stroke, is currently having a procedure in the hospital today. In the morning, may feel her balance isn't great.  Has not had recurrent seizure in several years.  Indicates, she sleeps well, unclear if she snores, feels rested throughout the day. Has not taken Maxalt for these headaches, due to haven not eaten when they occur.  Presents today for evaluation unaccompanied.  REVIEW OF  SYSTEMS: Out of a complete 14 system review of symptoms, the patient complains only of the following symptoms, and all other reviewed systems are negative.  Headache  ALLERGIES: Allergies  Allergen Reactions  . Sulfa Drugs Cross Reactors     HOME MEDICATIONS: Outpatient Medications Prior to Visit  Medication Sig Dispense Refill  . albuterol (PROAIR HFA) 108 (90 Base) MCG/ACT inhaler Inhale 1 puff into the lungs as needed.     Marland Kitchen aspirin-acetaminophen-caffeine (EXCEDRIN MIGRAINE) 250-250-65 MG tablet Take 1 tablet by mouth every 6 (six) hours as needed for headache.    . fluticasone (FLONASE) 50 MCG/ACT nasal spray Place 1 spray into both nostrils daily.    . rizatriptan (MAXALT) 10 MG tablet Take 1 tablet at the onset of migraine. May repeat in 2 hours if needed. Do not exceed 2 tablets in 24 hours. 10 tablet 5  . UNKNOWN TO PATIENT Take 1 Dose by mouth 2 (two) times daily.    Marland Kitchen gabapentin (NEURONTIN) 300 MG capsule Take 1 capsule (300 mg total) by mouth at bedtime. 30 capsule 3   No facility-administered medications prior to visit.    PAST MEDICAL HISTORY: Past Medical History:  Diagnosis Date  . Asthma   . Breast cancer ([place])   . Cerebral palsy ([place])   . Degenerative arthritis   . Depression with anxiety   . Gastroesophageal reflux disease   . 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 Migraine without aura,  without mention of intractable migraine without mention of status migrainosus   . Obesity   . Personal history of chemotherapy   . Personal history of radiation therapy   . Sleep apnea    father    PAST SURGICAL HISTORY: Past Surgical History:  Procedure Laterality Date  . BREAST LUMPECTOMY Right 1998  . BREAST LUMPECTOMY Left 2008  . FOOT CAPSULE RELEASE W/ PERCUTANEOUS HEEL CORD LENGTHENING, TIBIAL TENDON TRANSFER Right   . HERNIA REPAIR    . OOPHORECTOMY  Bilateral   . ovaries remove      FAMILY HISTORY: Family History  Problem Relation Age of Onset  . Seizures Mother   . Stroke Mother   . Hypertension Father   . Diabetes Father   . Obesity Sister     SOCIAL HISTORY: Social History   Socioeconomic History  . Marital status: Married    Spouse name: Remo Lipps   . Number of children: 0  . Years of education: HS  . Highest education level: Not on file  Occupational History  . Occupation: Disabled   Tobacco Use  . Smoking status: Never Smoker  . Smokeless tobacco: Never Used  Substance and Sexual Activity  . Alcohol use: No    Alcohol/week: 0.0 standard drinks  . Drug use: No  . Sexual activity: Yes  Other Topics Concern  . Not on file  Social History Narrative   Patient is married to Puckett and lives with him and roommate.    Patient has no children.    Patient is on disability.    Patient is left handed.    Patient has some community college.    Social Determinants of Health   Financial Resource Strain:   . Difficulty of Paying Living Expenses: Not on file  Food Insecurity:   . Worried About Charity fundraiser in the Last Year: Not on file  . Ran Out of Food in the Last Year: Not on file  Transportation Needs:   . Lack of Transportation (Medical): Not on file  . Lack of Transportation (Non-Medical): Not on file  Physical Activity:   . Days of Exercise per Week: Not on file  . Minutes of Exercise per Session: Not on file  Stress:   . Feeling of Stress : Not on file  Social Connections:   . Frequency of Communication with Friends and Family: Not on file  . Frequency of Social Gatherings with Friends and Family: Not on file  . Attends Religious Services: Not on file  . Active Member of Clubs or Organizations: Not on file  . Attends Archivist Meetings: Not on file  . Marital Status: Not on file  Intimate Partner Violence:   . Fear of Current or Ex-Partner: Not on file  . Emotionally Abused: Not on  file  . Physically Abused: Not on file  . Sexually Abused: Not on file   PHYSICAL EXAM  Vitals:   08/03/20 0838  BP: 126/84  Pulse: 84  Weight: 235 lb 9.6 oz (106.9 kg)  Height: $Remove'5\' 4"'whwERnC$  (1.626 m)   Body mass index is 40.44 kg/m.  Generalized: Well developed, in no acute distress   Neurological examination  Mentation: Alert oriented to time, place, history taking. Follows all commands speech and language fluent Cranial nerve II-XII: Pupils were equal round reactive to light. Extraocular movements were full, visual field were full on confrontational test. Facial sensation and strength were normal.  Head turning and shoulder shrug  were normal and  symmetric. Motor: The motor testing reveals 5 over 5 strength of all 4 extremities. Good symmetric motor tone is noted throughout.  Sensory: Sensory testing is intact to soft touch on all 4 extremities. No evidence of extinction is noted.  Coordination: Cerebellar testing reveals good finger-nose-finger and heel-to-shin bilaterally.  Gait and station: Gait is normal but somewhat wide-based. Reflexes: Deep tendon reflexes are symmetric and normal bilaterally.   DIAGNOSTIC DATA (LABS, IMAGING, TESTING) - I reviewed patient records, labs, notes, testing and imaging myself where available.  Lab Results  Component Value Date   WBC 6.4 03/08/2011   HGB 13.0 03/08/2011   HCT 37.7 03/08/2011   MCV 91.4 03/08/2011   PLT 263 03/08/2011      Component Value Date/Time   NA 139 03/08/2011 1525   K 3.7 03/08/2011 1525   CL 108 03/08/2011 1525   CO2 24 03/08/2011 1525   GLUCOSE 99 03/08/2011 1525   BUN 7 03/08/2011 1525   CREATININE 0.50 03/08/2011 1525   CALCIUM 9.0 03/08/2011 1525   PROT 6.7 03/08/2011 1525   ALBUMIN 3.7 03/08/2011 1525   AST 24 03/08/2011 1525   ALT 24 03/08/2011 1525   ALKPHOS 51 03/08/2011 1525   BILITOT 0.7 03/08/2011 1525   GFRNONAA >60 04/24/2007 0934   GFRAA  04/24/2007 0934    >60        The eGFR has been  calculated using the MDRD equation. This calculation has not been validated in all clinical   No results found for: CHOL, HDL, LDLCALC, LDLDIRECT, TRIG, CHOLHDL No results found for: HGBA1C No results found for: VITAMINB12 No results found for: TSH    ASSESSMENT AND PLAN 59 y.o. year old female  has a past medical history of Asthma, Breast cancer ([place]), Cerebral palsy ([place]), Degenerative arthritis, Depression with anxiety, Gastroesophageal reflux disease, Localization-related (focal) (partial) epilepsy and epileptic syndromes with complex partial seizures, without mention of intractable epilepsy, Migraine without aura, without mention of intractable migraine without mention of status migrainosus, Obesity, Personal history of chemotherapy, Personal history of radiation therapy, and Sleep apnea. here with:  1.  Migraine headaches 2.  History of seizures 3.  Morning headache  -Will send her for sleep evaluation to rule out sleep apnea, and given report of 3-4 morning headaches that are consistent, also BMI of 40  -We will continue gabapentin 300 mg at bedtime, Maxalt as needed, can  adjust medications if OSA is ruled out  -Not on seizure medications, no seizure in quite a while  -Follow-up in 6 months or sooner if needed  I spent 30 minutes of face-to-face and non-face-to-face time with patient.  This included previsit chart review, lab review, study review, order entry, electronic health record documentation, patient education.  Butler Denmark, AGNP-C, DNP 08/03/2020, 9:13 AM [facility] [address] [place], [place] [address] [phone]

## 2020-08-03 NOTE — Patient Instructions (Signed)
For now continue current medications  Will send you for sleep study to screen for sleep apnea See you back in 6 months

## 2020-09-06 ENCOUNTER — Ambulatory Visit: Payer: BC Managed Care – PPO | Admitting: Neurology

## 2020-09-06 ENCOUNTER — Encounter: Payer: Self-pay | Admitting: Neurology

## 2020-09-06 ENCOUNTER — Other Ambulatory Visit: Payer: Self-pay

## 2020-09-06 VITALS — BP 133/92 | HR 89 | Ht 63.0 in | Wt 234.0 lb

## 2020-09-06 DIAGNOSIS — R519 Headache, unspecified: Secondary | ICD-10-CM | POA: Diagnosis not present

## 2020-09-06 DIAGNOSIS — R0683 Snoring: Secondary | ICD-10-CM

## 2020-09-06 DIAGNOSIS — R351 Nocturia: Secondary | ICD-10-CM

## 2020-09-06 DIAGNOSIS — Z820 Family history of epilepsy and other diseases of the nervous system: Secondary | ICD-10-CM

## 2020-09-06 NOTE — Progress Notes (Signed)
Subjective:    Patient ID: Debbie Peck is a 59 y.o. female.  HPI     Star Age, MD, PhD [facility] [address]O. [place], [place] [address]  Dear Judson Roch and Lanny Hurst,   I saw your patient, [name], upon your kind request, in my Sleep clinic today for initial consultation of her sleep disorder, in particular, concern for underlying obstructive sleep apnea.  The patient is unaccompanied today.  As you know, Ms., is a 59 year old right-handed woman with an underlying medical history of asthma, breast cancer, arthritis, anxiety, reflux disease, seizures, recurrent headaches and obesity with a BMI of over 40, who reports snoring and recurrent headaches including morning headaches.  She has sleep disruption, difficulty maintaining sleep.  She goes to bed around 11:20 PM and rise time is between 6 and 7 AM.  She lives with her husband and a roommate.  She has several pets in the household.  She has had recurrent sinus problems and left eye infection recently.  She does not watch TV in the bedroom and tries to read before falling asleep.  She has nocturia about once or twice per average night and has woken up with a headache on average 3 times a week.  She drinks caffeine in the form of coffee, 2 to 3 cups in the mornings and some green tea.  She drinks alcohol occasionally and is a non-smoker.  She is retired. Her Epworth sleepiness score is 4 out of 24, fatigue severity score is 34 out of 63.  She has recently been started on gabapentin at bedtime.  She reports a family history of sleep apnea in her father and one nephew.  Her Past Medical History Is Significant For: Past Medical History:  Diagnosis Date  . Asthma   . Breast cancer ([place])   . Cerebral palsy ([place])   . Degenerative arthritis   . Depression with anxiety   . Gastroesophageal reflux disease   . Localization-related (focal) (partial) epilepsy and epileptic syndromes with complex partial  seizures, without mention of intractable epilepsy   . Migraine without aura, without mention of intractable migraine without mention of status migrainosus   . Obesity   . Personal history of chemotherapy   . Personal history of radiation therapy   . Sleep apnea    father    Her Past Surgical History Is Significant For: Past Surgical History:  Procedure Laterality Date  . BREAST LUMPECTOMY Right 1998  . BREAST LUMPECTOMY Left 2008  . FOOT CAPSULE RELEASE W/ PERCUTANEOUS HEEL CORD LENGTHENING, TIBIAL TENDON TRANSFER Right   . HERNIA REPAIR    . OOPHORECTOMY Bilateral   . ovaries remove      Her Family History Is Significant For: Family History  Problem Relation Age of Onset  . Seizures Mother   . Stroke Mother   . Hypertension Father   . Diabetes Father   . Obesity Sister     Her Social History Is Significant For: Social History   Socioeconomic History  . Marital status: Married    Spouse name: Remo Lipps   . Number of children: 0  . Years of education: HS  . Highest education level: Not on file  Occupational History  . Occupation: Disabled   Tobacco Use  . Smoking status: Never Smoker  . Smokeless tobacco: Never Used  Substance and Sexual Activity  . Alcohol use: No    Alcohol/week: 0.0 standard drinks  . Drug use: No  . Sexual  activity: Yes  Other Topics Concern  . Not on file  Social History Narrative   Patient is married to [place] and lives with him and roommate.    Patient has no children.    Patient is on disability.    Patient is left handed.    Patient has some community college.    Social Determinants of Health   Financial Resource Strain:   . Difficulty of Paying Living Expenses: Not on file  Food Insecurity:   . Worried About Charity fundraiser in the Last Year: Not on file  . Ran Out of Food in the Last Year: Not on file  Transportation Needs:   . Lack of Transportation (Medical): Not on file  . Lack of Transportation (Non-Medical): Not on  file  Physical Activity:   . Days of Exercise per Week: Not on file  . Minutes of Exercise per Session: Not on file  Stress:   . Feeling of Stress : Not on file  Social Connections:   . Frequency of Communication with Friends and Family: Not on file  . Frequency of Social Gatherings with Friends and Family: Not on file  . Attends Religious Services: Not on file  . Active Member of Clubs or Organizations: Not on file  . Attends Archivist Meetings: Not on file  . Marital Status: Not on file    Her Allergies Are:  Allergies  Allergen Reactions  . Sulfa Drugs Cross Reactors   :   Her Current Medications Are:  Outpatient Encounter Medications as of 09/06/2020  Medication Sig  . albuterol (PROAIR HFA) 108 (90 Base) MCG/ACT inhaler Inhale 1 puff into the lungs as needed.   Marland Kitchen aspirin-acetaminophen-caffeine (EXCEDRIN MIGRAINE) 250-250-65 MG tablet Take 1 tablet by mouth every 6 (six) hours as needed for headache.  . fluticasone (FLONASE) 50 MCG/ACT nasal spray Place 1 spray into both nostrils daily.  Marland Kitchen gabapentin (NEURONTIN) 300 MG capsule Take 1 capsule (300 mg total) by mouth at bedtime.  . rizatriptan (MAXALT) 10 MG tablet Take 1 tablet at the onset of migraine. May repeat in 2 hours if needed. Do not exceed 2 tablets in 24 hours.  Marland Kitchen UNKNOWN TO PATIENT Take 1 Dose by mouth 2 (two) times daily.   No facility-administered encounter medications on file as of 09/06/2020.  :  Review of Systems:  Out of a complete 14 point review of systems, all are reviewed and negative with the exception of these symptoms as listed below: Review of Systems  Objective:  Neurological Exam  Physical Exam Physical Examination:   Vitals:   09/06/20 0902  BP: (!) 133/92  Pulse: 89   General Examination: The patient is a very pleasant 59 y.o. female in no acute distress. She appears well-developed and well-nourished and well groomed.   HEENT: Normocephalic, atraumatic, pupils are equal,  round and reactive to light, extraocular tracking is good without limitation to gaze excursion or nystagmus noted. Hearing is grossly intact. Face is symmetric with normal facial animation. Speech is clear with no dysarthria noted. There is no hypophonia. There is no lip, neck/head, jaw or voice tremor. Neck is supple with full range of passive and active motion. There are no carotid bruits on auscultation. Oropharynx exam reveals: mild mouth dryness, good dental hygiene and moderate airway crowding, due to tonsillar size of about 3+, slightly longer uvula noted, Mallampati class II.  Tongue protrudes centrally in palate elevates symmetrically.  Neck circumference of 17-1/8 inches.  Chest: Clear  to auscultation without wheezing, rhonchi or crackles noted.  Heart: S1+S2+0, regular and normal without murmurs, rubs or gallops noted.   Abdomen: Soft, non-tender and non-distended with normal bowel sounds appreciated on auscultation.  Extremities: There is trace pitting edema in the left ankle, no bruising.    Skin: Warm and dry without trophic changes noted.   Musculoskeletal: exam reveals no obvious joint deformities, tenderness or joint swelling or erythema.   Neurologically:  Mental status: The patient is awake, alert and oriented in all 4 spheres. Her immediate and remote memory, attention, language skills and fund of knowledge are appropriate. There is no evidence of aphasia, agnosia, apraxia or anomia. Speech is clear with normal prosody and enunciation. Thought process is linear. Mood is normal and affect is normal.  Cranial nerves II - XII are as described above under HEENT exam.  Motor exam: Normal bulk, strength and tone is noted. There is no tremor, fine motor skills and coordination: grossly intact.  Cerebellar testing: No dysmetria or intention tremor. There is no truncal or gait ataxia.  Sensory exam: intact to light touch in the upper and lower extremities.  Gait, station and balance:  She stands easily. No veering to one side is noted. No leaning to one side is noted. Posture is age-appropriate and stance is narrow based. Gait shows normal stride length and normal pace. No problems turning are noted.  Slight limp on the left.  Assessment and Plan:   In summary, [place] is a very pleasant 59 y.o.-year old female with an underlying medical history of asthma, breast cancer, arthritis, anxiety, reflux disease, seizures, recurrent headaches and obesity with a BMI of over 40, whose history and physical exam are concerning for obstructive sleep apnea (OSA). I had a long chat with the patient about my findings and the diagnosis of OSA, its prognosis and treatment options. We talked about medical treatments, surgical interventions and non-pharmacological approaches. I explained in particular the risks and ramifications of untreated moderate to severe OSA, especially with respect to developing cardiovascular disease down the Road, including congestive heart failure, difficult to treat hypertension, cardiac arrhythmias, or stroke. Even type 2 diabetes has, in part, been linked to untreated OSA. Symptoms of untreated OSA include daytime sleepiness, memory problems, mood irritability and mood disorder such as depression and anxiety, lack of energy, as well as recurrent headaches, especially morning headaches. We talked about trying to maintain a healthy lifestyle in general, as well as the importance of weight control. We also talked about the importance of good sleep hygiene. I recommended the following at this time: sleep study.  I explained the sleep test procedure to the patient and also outlined possible surgical and non-surgical treatment options of OSA. I also explained the CPAP treatment option to the patient, who indicated that she would be willing to try CPAP if the need arises. I explained the importance of being compliant with PAP treatment, not only for insurance purposes but  primarily to improve Her symptoms, and for the patient's long term health benefit, including to reduce Her cardiovascular risks. I answered all her questions today and the patient was in agreement. I plan to see her back after the sleep study is completed and encouraged her to call with any interim questions, concerns, problems or updates.   Thank you very much for allowing me to participate in the care of this nice patient. If I can be of any further assistance to you please do not hesitate to talk to me.  Sincerely,   Star Age, MD, PhD

## 2020-09-06 NOTE — Patient Instructions (Signed)
Thank you for choosing [facility] for your sleep related care!  Here is what we discussed today and what we came up with as our plan for you:    Based on your symptoms and your exam I believe you are at risk for obstructive sleep apnea (aka OSA), and I think we should proceed with a sleep study to determine whether you do or do not have OSA and how severe it is. Even, if you have mild OSA, I may want you to consider treatment with CPAP, as treatment of even borderline or mild sleep apnea can result and improvement of symptoms such as sleep disruption, daytime sleepiness, nighttime bathroom breaks, restless leg symptoms, improvement of headache syndromes, even improved mood disorder.   As explained, an attended sleep study meaning you get to stay overnight in the sleep lab, lets [place] monitor sleep-related behaviors such as sleep talking and leg movements in sleep, in addition to monitoring for sleep apnea.  A home sleep test is a screening tool for sleep apnea only, and unfortunately does not help with any other sleep-related diagnoses.  Please remember, the long-term risks and ramifications of untreated moderate to severe obstructive sleep apnea are: increased Cardiovascular disease, including congestive heart failure, stroke, difficult to control hypertension, treatment resistant obesity, arrhythmias, especially irregular heartbeat commonly known as A. Fib. (atrial fibrillation); even type 2 diabetes has been linked to untreated OSA.   Sleep apnea can cause disruption of sleep and sleep deprivation in most cases, which, in turn, can cause recurrent headaches, problems with memory, mood, concentration, focus, and vigilance. Most people with untreated sleep apnea report excessive daytime sleepiness, which can affect their ability to drive. Please do not drive if you feel sleepy. Patients with sleep apnea can also develop difficulty initiating and maintaining sleep (aka insomnia).    Having sleep apnea may increase your risk for other sleep disorders, including involuntary behaviors sleep such as sleep terrors, sleep talking, sleepwalking.    Having sleep apnea can also increase your risk for restless leg syndrome and leg movements at night.   Please note that untreated obstructive sleep apnea may carry additional perioperative morbidity. Patients with significant obstructive sleep apnea (typically, in the moderate to severe degree) should receive, if possible, perioperative PAP (positive airway pressure) therapy and the surgeons and particularly the anesthesiologists should be informed of the diagnosis and the severity of the sleep disordered breathing.   I will likely see you back after your sleep study to go over the test results and where to go from there. We will call you after your sleep study to advise about the results (most likely, you will hear from [facility], my nurse) and to set up an appointment at the time, as necessary.    Our sleep lab administrative assistant will call you to schedule your sleep study and give you further instructions, regarding the check in process for the sleep study, arrival time, what to bring, when you can expect to leave after the study, etc., and to answer any other logistical questions you may have. If you don't hear back from her by about 2 weeks from now, please feel free to call her direct line at [phone] or you can call our general clinic number, or email [place] through My Chart.

## 2020-09-27 ENCOUNTER — Ambulatory Visit (INDEPENDENT_AMBULATORY_CARE_PROVIDER_SITE_OTHER): Payer: BC Managed Care – PPO | Admitting: Neurology

## 2020-09-27 DIAGNOSIS — R351 Nocturia: Secondary | ICD-10-CM

## 2020-09-27 DIAGNOSIS — R0683 Snoring: Secondary | ICD-10-CM

## 2020-09-27 DIAGNOSIS — Z820 Family history of epilepsy and other diseases of the nervous system: Secondary | ICD-10-CM

## 2020-09-27 DIAGNOSIS — G4733 Obstructive sleep apnea (adult) (pediatric): Secondary | ICD-10-CM | POA: Diagnosis not present

## 2020-09-27 DIAGNOSIS — R519 Headache, unspecified: Secondary | ICD-10-CM

## 2020-09-30 ENCOUNTER — Telehealth: Payer: Self-pay | Admitting: *Deleted

## 2020-09-30 ENCOUNTER — Encounter: Payer: Self-pay | Admitting: *Deleted

## 2020-09-30 NOTE — Addendum Note (Signed)
Addended by: Star Age on: 09/30/2020 07:49 AM   Modules accepted: Orders

## 2020-09-30 NOTE — Telephone Encounter (Signed)
-----   Message from Star Age, MD sent at 09/30/2020  7:49 AM EST ----- Patient referred by Butler Denmark, NP, seen by me on 09/06/20, HST on 09/27/20.    Please call and notify the patient that the recent home sleep test showed obstructive sleep apnea in the moderate range. I recommend treatment for this in the form of autoPAP, which means, that we don't have to bring her in for a sleep study with CPAP, but will let her try an autoPAP machine at home, through a DME company (of her choice, or as per insurance requirement). The DME representative will educate her on how to use the machine, how to put the mask on, etc. I have placed an order in the chart. Please send referral, talk to patient, send report to referring MD. We will need a FU in sleep clinic for 10 weeks post-PAP set up, please arrange that with me or one of our NPs. Thanks,   Star Age, MD, PhD [facility])

## 2020-09-30 NOTE — Progress Notes (Signed)
Patient referred by Butler Denmark, NP, seen by me on 09/06/20, HST on 09/27/20.    Please call and notify the patient that the recent home sleep test showed obstructive sleep apnea in the moderate range. I recommend treatment for this in the form of autoPAP, which means, that we don't have to bring her in for a sleep study with CPAP, but will let her try an autoPAP machine at home, through a DME company (of her choice, or as per insurance requirement). The DME representative will educate her on how to use the machine, how to put the mask on, etc. I have placed an order in the chart. Please send referral, talk to patient, send report to referring MD. We will need a FU in sleep clinic for 10 weeks post-PAP set up, please arrange that with me or one of our NPs. Thanks,   Star Age, MD, PhD [facility])

## 2020-09-30 NOTE — Telephone Encounter (Signed)
LVM for pt to call about results. °

## 2020-09-30 NOTE — Procedures (Signed)
   [facility]  HOME SLEEP TEST (Watch PAT)  STUDY DATE: 09/27/20  DOB: 12-Feb-1961  MRN: [id number]  ORDERING CLINICIAN: Star Age, MD, PhD   REFERRING CLINICIAN: Dr. Henderson Baltimore, NP  CLINICAL INFORMATION/HISTORY:  59 year old woman with a history of asthma, breast cancer, arthritis, anxiety, reflux disease, seizures, recurrent headaches and obesity with a BMI of over 40, who reports snoring and recurrent headaches including morning headaches.  She has sleep disruption, difficulty maintaining sleep.   Epworth sleepiness score: 4/24.  BMI: 46.9 kg/m  FINDINGS:   Total Record Time (hours, min): 7 H 7 min  Total Sleep Time (hours, min):  6 H 23 min   Percent REM (%):    24.15%   Calculated pAHI (per hour):  19.4      REM pAHI:    54.6     NREM pAHI: 8.3   Oxygen Saturation (%) Mean:  92  Minimum oxygen saturation (%):                 83   O2 Saturation Range (%): 83-97  O2Saturation (minutes) <=88%: 4.4 min   Pulse Mean (bpm):   91   Pulse Range (66-119)   IMPRESSION: OSA (obstructive sleep apnea), moderate  RECOMMENDATION:  This home sleep test demonstrates moderate obstructive sleep apnea with a total AHI of 19.4/hour and O2 nadir of 83%. Treatment with positive airway pressure is recommended. The patient will be advised to proceed with an autoPAP titration/trial at home for now. A full night titration study may be considered to optimize treatment settings, if needed down the road. Please note that untreated obstructive sleep apnea may carry additional perioperative morbidity. Patients with significant obstructive sleep apnea should receive perioperative PAP therapy and the surgeons and particularly the anesthesiologist should be informed of the diagnosis and the severity of the sleep disordered breathing. The patient should be cautioned not to drive, work at heights, or operate dangerous or heavy equipment when tired or sleepy. Review and  reiteration of good sleep hygiene measures should be pursued with any patient. Other causes of the patient's symptoms, including circadian rhythm disturbances, an underlying mood disorder, medication effect and/or an underlying medical problem cannot be ruled out based on this test. Clinical correlation is recommended. The patient and her referring provider will be notified of the test results. The patient will be seen in follow up in sleep clinic at [facility].  I certify that I have reviewed the raw data recording prior to the issuance of this report in accordance with the standards of the American Academy of Sleep Medicine (AASM).  INTERPRETING PHYSICIAN:    Star Age, MD, PhD  Board Certified in Neurology and Sleep Medicine  [facility] [address] [place], [place] [address] [phone]

## 2020-09-30 NOTE — Telephone Encounter (Signed)
Took call from phone staff and spoke with pt. Relayed results per Dr. Rexene Alberts note. She verbalized understanding. Agreeable to start on autopap. Aware orders being sent to [facility]. Provided her their phone number. Advised I will be sending a letter detailing what we spoke about today. Asked her to call back if she has any questions moving forward. She will call back to make follow up once [facility] gets her set up with machine. Sent community message to [employer] that order placed.

## 2020-11-04 ENCOUNTER — Ambulatory Visit: Payer: BC Managed Care – PPO | Attending: Internal Medicine

## 2020-11-04 DIAGNOSIS — Z23 Encounter for immunization: Secondary | ICD-10-CM

## 2020-11-04 NOTE — Progress Notes (Signed)
   Covid-19 Vaccination Clinic  Name:  Vanesa Renier    MRN: [id number] DOB: [birth year]  11/04/2020  Ms. Dalby was observed post Covid-19 immunization for 15 minutes without incident. She was provided with Vaccine Information Sheet and instruction to access the V-Safe system.   Ms. Mattila was instructed to call 911 with any severe reactions post vaccine: Marland Kitchen Difficulty breathing  . Swelling of face and throat  . A fast heartbeat  . A bad rash all over body  . Dizziness and weakness   Immunizations Administered    Name Date Dose VIS Date Route   Pfizer COVID-19 Vaccine 11/04/2020  4:15 PM 0.3 mL 09/08/2020 Intramuscular   Manufacturer: [place]   Lot: [id number]   [place]: [id number]

## 2021-01-31 ENCOUNTER — Ambulatory Visit: Payer: BC Managed Care – PPO | Admitting: Neurology

## 2021-04-06 ENCOUNTER — Ambulatory Visit: Payer: BC Managed Care – PPO | Admitting: Neurology

## 2021-04-06 ENCOUNTER — Encounter: Payer: Self-pay | Admitting: Neurology

## 2021-04-06 VITALS — BP 122/83 | HR 91 | Ht 63.0 in | Wt 228.0 lb

## 2021-04-06 DIAGNOSIS — G43009 Migraine without aura, not intractable, without status migrainosus: Secondary | ICD-10-CM

## 2021-04-06 DIAGNOSIS — G4733 Obstructive sleep apnea (adult) (pediatric): Secondary | ICD-10-CM | POA: Diagnosis not present

## 2021-04-06 DIAGNOSIS — G4730 Sleep apnea, unspecified: Secondary | ICD-10-CM | POA: Insufficient documentation

## 2021-04-06 MED ORDER — RIZATRIPTAN BENZOATE 10 MG PO TABS: ORAL_TABLET | ORAL | 5 refills | Status: DC

## 2021-04-06 MED ORDER — GABAPENTIN 300 MG PO CAPS: 300.0000 mg | ORAL_CAPSULE | Freq: Every day | ORAL | 11 refills | Status: DC

## 2021-04-06 NOTE — Progress Notes (Signed)
I have read the note, and I agree with the clinical assessment and plan.  Cameren Odwyer K Hilari Wethington   

## 2021-04-06 NOTE — Progress Notes (Signed)
PATIENT: Debbie Peck DOB: 1961-05-19  REASON FOR VISIT: follow up HISTORY FROM: patient  HISTORY OF PRESENT ILLNESS: Today 04/06/21 Debbie Peck is a 60 year old female with history of seizures and migraine headaches. Saw Dr. Rexene Alberts in Oct 2021 for sleep consult, sleep study was ordered.  Showed moderate OSA. Hasn't started Autopap, still waiting for machine, sent to [facility]. Has headache today due to lack of caffeine, only 1 cup of coffee so far. In March, problems with left shoulder, doing PT right now. Had injections, suggested surgery. Remains on gabapentin 300 mg at bedtime. In general headaches are doing well, no significant headaches. Allergies are horrible, wears masks. No seizures. Some numbness right 3rd and 4th finger on tips. Driving, lives with husband and roommate.  Here today unaccompanied.  Update 08/03/2020 SS: Debbie Peck is a 60 year old female history of seizures and migraine headaches.  When last seen, she had a reported increase in headache for 2 weeks straight.  She was placed on gabapentin, takes Maxalt as needed.  Taking gabapentin at bedtime, has been helpful for headache.  Continues to report on average 3-4 morning headaches, usually under located occipitally, spreads forward bilaterally, rarely takes Maxalt.  Will usually lie back down and go to sleep.  Unclear if she snores, her husband sleeps on the couch.  No significant fatigue or daytime drowsiness.  She does stay up late at night watching Netflix.  No seizures.  Does have chronic allergy issues.  Has noted over time, her balance is not as great. For last few years, headaches have become more morning focused. Presents today for evaluation unaccompanied.  HISTORY  04/26/2020 SS: Debbie Peck is a 60 year old female with history of seizures and migraine headaches.  She is not on any medication for either condition.  She takes Maxalt if needed.  Today, indicates for the last 2 weeks, has had morning headache.  Describes as  pressure to the top of her head, feels some sort of migraine variant, because feels tired.  Has not taken any medication for it, will drink water, cup of coffee.  After a few hours it will go away.  Her headaches are typically triggered by allergies and pollen.  She has had increased stress recently, her mother had a stroke, is currently having a procedure in the hospital today. In the morning, may feel her balance isn't great.  Has not had recurrent seizure in several years.  Indicates, she sleeps well, unclear if she snores, feels rested throughout the day. Has not taken Maxalt for these headaches, due to haven not eaten when they occur.  Presents today for evaluation unaccompanied.  REVIEW OF SYSTEMS: Out of a complete 14 system review of symptoms, the patient complains only of the following symptoms, and all other reviewed systems are negative.  See HPI  ALLERGIES: Allergies  Allergen Reactions  . Sulfa Drugs Cross Reactors     HOME MEDICATIONS: Outpatient Medications Prior to Visit  Medication Sig Dispense Refill  . albuterol (VENTOLIN HFA) 108 (90 Base) MCG/ACT inhaler Inhale 1 puff into the lungs as needed.     Marland Kitchen aspirin-acetaminophen-caffeine (EXCEDRIN MIGRAINE) 250-250-65 MG tablet Take 1 tablet by mouth every 6 (six) hours as needed for headache.    . fluticasone (FLONASE) 50 MCG/ACT nasal spray Place 1 spray into both nostrils daily.    Marland Kitchen gabapentin (NEURONTIN) 300 MG capsule Take 1 capsule (300 mg total) by mouth at bedtime. 30 capsule 5  . rizatriptan (MAXALT) 10 MG tablet Take 1 tablet  at the onset of migraine. May repeat in 2 hours if needed. Do not exceed 2 tablets in 24 hours. 10 tablet 5  . UNKNOWN TO PATIENT Take 1 Dose by mouth 2 (two) times daily.     No facility-administered medications prior to visit.    PAST MEDICAL HISTORY: Past Medical History:  Diagnosis Date  . Asthma   . Breast cancer ([place])   . Cerebral palsy ([employer])   . Degenerative arthritis   . Depression  with anxiety   . Gastroesophageal reflux disease   . 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 Migraine without aura, without mention of intractable migraine without mention of status migrainosus   . Obesity   . Personal history of chemotherapy   . Personal history of radiation therapy   . Sleep apnea    father    PAST SURGICAL HISTORY: Past Surgical History:  Procedure Laterality Date  . BREAST LUMPECTOMY Right 1998  . BREAST LUMPECTOMY Left 2008  . FOOT CAPSULE RELEASE W/ PERCUTANEOUS HEEL CORD LENGTHENING, TIBIAL TENDON TRANSFER Right   . HERNIA REPAIR    . OOPHORECTOMY Bilateral   . ovaries remove      FAMILY HISTORY: Family History  Problem Relation Age of Onset  . Seizures Mother   . Stroke Mother   . Hypertension Father   . Diabetes Father   . Obesity Sister     SOCIAL HISTORY: Social History   Socioeconomic History  . Marital status: Married    Spouse name: Remo Lipps   . Number of children: 0  . Years of education: HS  . Highest education level: Not on file  Occupational History  . Occupation: Disabled   Tobacco Use  . Smoking status: Never Smoker  . Smokeless tobacco: Never Used  Substance and Sexual Activity  . Alcohol use: No    Alcohol/week: 0.0 standard drinks  . Drug use: No  . Sexual activity: Yes  Other Topics Concern  . Not on file  Social History Narrative   Patient is married to [place] and lives with him and roommate.    Patient has no children.    Patient is on disability.    Patient is left handed.    Patient has some community college.    Social Determinants of Health   Financial Resource Strain: Not on file  Food Insecurity: Not on file  Transportation Needs: Not on file  Physical Activity: Not on file  Stress: Not on file  Social Connections: Not on file  Intimate Partner Violence: Not on file   PHYSICAL EXAM  Vitals:    04/06/21 0927  BP: 122/83  Pulse: 91  Weight: 228 lb (103.4 kg)  Height: $Remove'5\' 3"'oWdoPBA$  (1.6 m)   Body mass index is 40.39 kg/m.  Generalized: Well developed, in no acute distress   Neurological examination  Mentation: Alert oriented to time, place, history taking. Follows all commands speech and language fluent Cranial nerve II-XII: Pupils were equal round reactive to light. Extraocular movements were full, visual field were full on confrontational test. Facial sensation and strength were normal.  Head turning and shoulder shrug  were normal and symmetric. Motor: The motor testing reveals 5 over 5 strength of all 4 extremities. Good symmetric motor tone is noted throughout.  Sensory: Sensory testing is intact to soft touch on all 4 extremities. No evidence of extinction is noted.  Coordination: Cerebellar testing reveals good finger-nose-finger and heel-to-shin  bilaterally.  Gait and station: Gait is normal but somewhat wide-based.  Tandem gait is slightly unsteady. Reflexes: Deep tendon reflexes are symmetric and normal bilaterally.   DIAGNOSTIC DATA (LABS, IMAGING, TESTING) - I reviewed patient records, labs, notes, testing and imaging myself where available.  Lab Results  Component Value Date   WBC 6.4 03/08/2011   HGB 13.0 03/08/2011   HCT 37.7 03/08/2011   MCV 91.4 03/08/2011   PLT 263 03/08/2011      Component Value Date/Time   NA 139 03/08/2011 1525   K 3.7 03/08/2011 1525   CL 108 03/08/2011 1525   CO2 24 03/08/2011 1525   GLUCOSE 99 03/08/2011 1525   BUN 7 03/08/2011 1525   CREATININE 0.50 03/08/2011 1525   CALCIUM 9.0 03/08/2011 1525   PROT 6.7 03/08/2011 1525   ALBUMIN 3.7 03/08/2011 1525   AST 24 03/08/2011 1525   ALT 24 03/08/2011 1525   ALKPHOS 51 03/08/2011 1525   BILITOT 0.7 03/08/2011 1525   GFRNONAA >60 04/24/2007 0934   GFRAA  04/24/2007 0934    >60        The eGFR has been calculated using the MDRD equation. This calculation has not been validated in  all clinical   No results found for: CHOL, HDL, LDLCALC, LDLDIRECT, TRIG, CHOLHDL No results found for: HGBA1C No results found for: VITAMINB12 No results found for: TSH  ASSESSMENT AND PLAN 60 y.o. year old female  has a past medical history of Asthma, Breast cancer ([facility]), Cerebral palsy ([place]), Degenerative arthritis, Depression with anxiety, Gastroesophageal reflux disease, Localization-related (focal) (partial) epilepsy and epileptic syndromes with complex partial seizures, without mention of intractable epilepsy, Migraine without aura, without mention of intractable migraine without mention of status migrainosus, Obesity, Personal history of chemotherapy, Personal history of radiation therapy, and Sleep apnea. here with:  1.  Migraine headaches 2.  History of seizures 3.  Morning headache  -Sleep evaluation showed moderate OSA, planning to start AutoPap through [facility], but has been waiting for a machine, we will follow-up on this, this process has been delayed with the pandemic  -Headaches are well controlled, will continue gabapentin 300 mg at bedtime, Maxalt as needed  -Not on seizure medications, no seizure in quite a while  -Follow-up in 1 year or sooner if needed  Evangeline Dakin, DNP 04/06/2021, 9:55 AM [facility] [address] [place], [place] [address] [phone]

## 2021-04-06 NOTE — Patient Instructions (Signed)
Once you receive the CPAP let me know, need to see you within 30-90 days of starting Continue other medications See you back in 1 year

## 2021-04-07 ENCOUNTER — Telehealth: Payer: Self-pay

## 2021-04-07 NOTE — Telephone Encounter (Signed)
I reached out to [facility] to get information about pt's start date for machine. Per [facility]  "Pt was a no-show for her setup. Will have location reach out for rescheduling."  Janett Billow

## 2021-09-22 ENCOUNTER — Ambulatory Visit: Payer: BC Managed Care – PPO | Admitting: Neurology

## 2021-09-22 ENCOUNTER — Encounter: Payer: Self-pay | Admitting: Neurology

## 2021-09-22 VITALS — BP 136/76 | HR 84 | Ht 63.0 in | Wt 222.4 lb

## 2021-09-22 DIAGNOSIS — G4733 Obstructive sleep apnea (adult) (pediatric): Secondary | ICD-10-CM | POA: Diagnosis not present

## 2021-09-22 DIAGNOSIS — Z9989 Dependence on other enabling machines and devices: Secondary | ICD-10-CM | POA: Diagnosis not present

## 2021-09-22 NOTE — Progress Notes (Signed)
Subjective:    Patient ID: Debbie Peck is a 60 y.o. female.  HPI    Interim history:   Ms. Debbie Peck is a 60 year old right-handed woman with an underlying medical history of asthma, breast cancer, arthritis, anxiety, reflux disease, seizures, recurrent headaches and obesity with a BMI of over 59, who presents for follow-up consultation of her obstructive sleep apnea, after interim testing and starting AutoPap therapy.  The patient is unaccompanied today.  I first met her on 09/06/2021 at the request of Dr. Jannifer Franklin and Butler Denmark, NP, at which time she reported snoring and recurrent headaches including morning headaches as well as difficulty maintaining sleep.  She was advised to proceed with a sleep test, she had a home sleep test on 09/28/1999 2020 which indicated moderate obstructive sleep apnea with an AHI of 19.4/h, O2 nadir 83%.  She was advised to proceed with AutoPap therapy at home.  Her set up date was 06/15/2021.  Today, 09/22/2021: I reviewed her AutoPap compliance data from 09/18/2021 through 09/20/2021, during which time she used her machine every night with percent used days greater than 4 hour at 93%, indicating excellent compliance with an average usage of 6 hours and 19 minutes, residual AHI at goal at 2.1/h, leak on the high side with a 95th percentile at 33.7 L/min, 95th percentile of pressure at 10.7 cm with a range of 6 cm to 12 cm with.  She reports having adjusted well to treatment but does report difficulty with the mask as she is a side sleeper and she uses a full facemask.  She does have nasal congestion.  Sometimes it is seasonal with a flareup of her allergy symptoms so she opted for full facemask.  She is working on weight loss and tries to stay active by walking.  She has benefited from treatment of her sleep apnea, in that her sleep quality and sleep consolidation have improved.  She is motivated to continue with treatment.  She has received a replacement set of supplies.  The  patient's allergies, current medications, family history, past medical history, past social history, past surgical history and problem list were reviewed and updated as appropriate.     Previously:   09/06/20: (She) reports snoring and recurrent headaches including morning headaches.  She has sleep disruption, difficulty maintaining sleep.  She goes to bed around 11:20 PM and rise time is between 6 and 7 AM.  She lives with her husband and a roommate.  She has several pets in the household.  She has had recurrent sinus problems and left eye infection recently.  She does not watch TV in the bedroom and tries to read before falling asleep.  She has nocturia about once or twice per average night and has woken up with a headache on average 3 times a week.  She drinks caffeine in the form of coffee, 2 to 3 cups in the mornings and some green tea.  She drinks alcohol occasionally and is a non-smoker.  She is retired. Her Epworth sleepiness score is 4 out of 24, fatigue severity score is 34 out of 63.  She has recently been started on gabapentin at bedtime.  She reports a family history of sleep apnea in her father and one nephew.  Her Past Medical History Is Significant For: Past Medical History:  Diagnosis Date   Asthma    Breast cancer ([place])    Cerebral palsy ([facility])    Degenerative arthritis    Depression with anxiety  Gastroesophageal reflux disease    Localization-related (focal) (partial) epilepsy and epileptic syndromes with complex partial seizures, without mention of intractable epilepsy    Migraine without aura, without mention of intractable migraine without mention of status migrainosus    Obesity    Personal history of chemotherapy    Personal history of radiation therapy    Sleep apnea    father    Her Past Surgical History Is Significant For: Past Surgical History:  Procedure Laterality Date   BREAST LUMPECTOMY Right 1998   BREAST LUMPECTOMY Left 2008   FOOT CAPSULE RELEASE W/  PERCUTANEOUS HEEL CORD LENGTHENING, TIBIAL TENDON TRANSFER Right    HERNIA REPAIR     OOPHORECTOMY Bilateral    ovaries remove      Her Family History Is Significant For: Family History  Problem Relation Age of Onset   Seizures Mother    Stroke Mother    Hypertension Father    Diabetes Father    Obesity Sister     Her Social History Is Significant For: Social History   Socioeconomic History   Marital status: Married    Spouse name: Remo Lipps    Number of children: 0   Years of education: HS   Highest education level: Not on file  Occupational History   Occupation: Disabled   Tobacco Use   Smoking status: Never   Smokeless tobacco: Never  Substance and Sexual Activity   Alcohol use: No    Alcohol/week: 0.0 standard drinks   Drug use: No   Sexual activity: Yes  Other Topics Concern   Not on file  Social History Narrative   Patient is married to Debbie Peck and lives with him and roommate.    Patient has no children.    Patient is on disability.    Patient is left handed.    Patient has some community college.    Social Determinants of Health   Financial Resource Strain: Not on file  Food Insecurity: Not on file  Transportation Needs: Not on file  Physical Activity: Not on file  Stress: Not on file  Social Connections: Not on file    Her Allergies Are:  Allergies  Allergen Reactions   Sulfa Drugs Cross Reactors   :   Her Current Medications Are:  Outpatient Encounter Medications as of 09/22/2021  Medication Sig   albuterol (VENTOLIN HFA) 108 (90 Base) MCG/ACT inhaler Inhale 1 puff into the lungs as needed.    aspirin-acetaminophen-caffeine (EXCEDRIN MIGRAINE) 250-250-65 MG tablet Take 1 tablet by mouth every 6 (six) hours as needed for headache.   fluticasone (FLONASE) 50 MCG/ACT nasal spray Place 1 spray into both nostrils daily.   gabapentin (NEURONTIN) 300 MG capsule Take 1 capsule (300 mg total) by mouth at bedtime.   rizatriptan (MAXALT) 10 MG tablet Take 1  tablet at the onset of migraine. May repeat in 2 hours if needed. Do not exceed 2 tablets in 24 hours.   UNKNOWN TO PATIENT Take 1 Dose by mouth 2 (two) times daily.   No facility-administered encounter medications on file as of 09/22/2021.  :  Review of Systems:  Out of a complete 14 point review of systems, all are reviewed and negative with the exception of these symptoms as listed below:   Review of Systems  Neurological:        Initial cpap set up 06-15-21. ESS: 3   Objective:  Neurological Exam  Physical Exam Physical Examination:   Vitals:   09/22/21 1305  BP: 136/76  Pulse: 84    General Examination: The patient is a very pleasant 60 y.o. female in no acute distress. She appears well-developed and well-nourished and well groomed.   HEENT: Normocephalic, atraumatic, pupils are equal, round and reactive to light, extraocular tracking is good without limitation to gaze excursion or nystagmus noted. Hearing is grossly intact. Face is symmetric with normal facial animation. Speech is clear with no dysarthria noted. There is no hypophonia. There is no lip, neck/head, jaw or voice tremor. Neck is supple with full range of passive and active motion. There are no carotid bruits on auscultation. Oropharynx exam reveals: mild mouth dryness, good dental hygiene and moderate airway crowding.  Tongue protrudes centrally in palate elevates symmetrically.     Chest: Clear to auscultation without wheezing, rhonchi or crackles noted.   Heart: S1+S2+0, regular and normal without murmurs, rubs or gallops noted.    Abdomen: Soft, non-tender and non-distended with normal bowel sounds appreciated on auscultation.   Extremities: There is no obvious edema in the distal lower extremities/ankles..     Skin: Warm and dry without trophic changes noted.    Musculoskeletal: exam reveals no obvious joint deformities.    Neurologically:  Mental status: The patient is awake, alert and oriented in all 4  spheres. Her immediate and remote memory, attention, language skills and fund of knowledge are appropriate. There is no evidence of aphasia, agnosia, apraxia or anomia. Speech is clear with normal prosody and enunciation. Thought process is linear. Mood is normal and affect is normal.  Cranial nerves II - XII are as described above under HEENT exam.  Motor exam: Normal bulk, strength and tone is noted. There is no tremor, fine motor skills and coordination: grossly intact.  Cerebellar testing: No dysmetria or intention tremor. There is no truncal or gait ataxia.  Sensory exam: intact to light touch in the upper and lower extremities.  Gait, station and balance: She stands easily. No veering to one side is noted. No leaning to one side is noted. Posture is age-appropriate and stance is narrow based. Gait shows normal stride length and normal pace. No problems turning are noted.    Assessment and Plan:    In summary, [place] Spirito is a very pleasant 60 year old female with an underlying medical history of asthma, breast cancer, arthritis, anxiety, reflux disease, seizures, recurrent headaches and obesity with a BMI of over 40, who presents for follow-up consultation of her obstructive sleep apnea, which was deemed in the moderate range by home sleep testing on 09/27/2020.  She established treatment with AutoPap therapy on 06/15/2021.  She is compliant with treatment and reports benefit from treatment.  She is commended for her treatment adherence.  She is encouraged to continue to work on weight loss.  She is advised regarding her compliance and we went over the numbers in detail today.  She also tracks her compliance on her cell phone app.  At this juncture standpoint she is doing rather well.  She is advised to follow-up routinely to see one of our nurse practitioners in 1 year. I answered all her questions today and she was in agreement.

## 2021-09-22 NOTE — Patient Instructions (Signed)
It was nice to see you again today.  You are compliant with your AutoPap and I am glad to hear that you have benefited from treatment.  Please continue using your autoPAP regularly. While your insurance requires that you use PAP at least 4 hours each night on 70% of the nights, I recommend, that you not skip any nights and use it throughout the night if you can. Getting used to PAP and staying with the treatment long term does take time and patience and discipline. Untreated obstructive sleep apnea when it is moderate to severe can have an adverse impact on cardiovascular health and raise her risk for heart disease, arrhythmias, hypertension, congestive heart failure, stroke and diabetes. Untreated obstructive sleep apnea causes sleep disruption, nonrestorative sleep, and sleep deprivation. This can have an impact on your day to day functioning and cause daytime sleepiness and impairment of cognitive function, memory loss, mood disturbance, and problems focussing. Using PAP regularly can improve these symptoms.  Please follow-up routinely to see one of our nurse practitioners in 1 year and sleep clinic.

## 2021-10-24 ENCOUNTER — Telehealth: Payer: Self-pay | Admitting: Neurology

## 2021-10-24 NOTE — Telephone Encounter (Signed)
Pt called, mask that [employer],sent only one of them would attach to my hose. The plain mask did not have ridges, need ridges to snap on. Spoke with [facility], sent out mask, but they the wrong mask (do not fit). [employer] said if do not fit you can return the mask.  Would like a call from the nurse.

## 2021-10-25 NOTE — Telephone Encounter (Signed)
Called patient spoke to her about her mask supply issue.  I relayed she needs to really contact the [facility]. I will email her through [facility] the address so that she may call and make a trip down there in relation to her mask issues.  She verbalized understanding and appreciation .  She will call back as needed.

## 2022-02-13 DIAGNOSIS — J4530 Mild persistent asthma, uncomplicated: Secondary | ICD-10-CM | POA: Insufficient documentation

## 2022-02-13 DIAGNOSIS — H1045 Other chronic allergic conjunctivitis: Secondary | ICD-10-CM | POA: Insufficient documentation

## 2022-02-13 DIAGNOSIS — Z91148 Patient's other noncompliance with medication regimen for other reason: Secondary | ICD-10-CM | POA: Insufficient documentation

## 2022-04-04 NOTE — Progress Notes (Signed)
Patient: Debbie Peck Date of Birth: 1960-12-30  Reason for Visit: Follow up History from: Patient Primary Neurologist: Dr. Rexene Alberts for sleep; Dr. April Manson for seizures  ASSESSMENT AND PLAN 61 y.o. year old female   Seizure Migraine Headache  OSA on CPAP -History of at least 10 years since last seizure, likely longer, last on carbamazepine 400 mg 12 hr tablet twice daily, stopped back in 2014 due to cost  -Had seizure yesterday 04/05/2022, seems typical event, witnessed by her elderly father so exact details are poor, but she had typical nausea as aura beforehand, chewing to tongue, fatigue afterward -Will start Trileptal working up to 300 mg twice daily for seizure prevention, cleaner version of carbamazepine  -Check CBC, CMP today, get baseline labs/sodium level  -Order EEG -No driving until seizure free 6 months -Continue gabapentin at bedtime for migraine prevention, Maxalt PRN for acute headache treatment -She will remain on CPAP, we did not address this today, has follow-up in November with Dr. Rexene Alberts -Call for any further seizure spells, return in 3-4 months with me or Dr. April Manson   Meds ordered this encounter  Medications   OXcarbazepine (TRILEPTAL) 150 MG tablet    Sig: Take 1 tablet twice daily for 2 weeks, then take 2 tablets twice daily    Dispense:  120 tablet    Refill:  5   rizatriptan (MAXALT) 10 MG tablet    Sig: Take 1 tablet at the onset of migraine. May repeat in 2 hours if needed. Do not exceed 2 tablets in 24 hours.    Dispense:  10 tablet    Refill:  5   gabapentin (NEURONTIN) 300 MG capsule    Sig: Take 1 capsule (300 mg total) by mouth at bedtime.    Dispense:  30 capsule    Refill:  11    HISTORY OF PRESENT ILLNESS: Today 04/06/22 Debbie Peck is here today for follow-up. Yesterday she was out with her dad at [facility], he was driving her home, she blacked out in the car. Felt as wave of nausea, her eyes closed, there was no description of shaking or  tensing, it was a 5 minute car ride, remembers feeing sick, then was back at home and felt tired. Has bite marks to both sides on tongue. It is sore today. Last seizure was before 2014, was on carbamazepine long acting 400 mg twice daily at that time, but she couldn't afford it so she stopped on her own. Under a lot of stress, caring for her parents in poor health. She is driving a car. In the past seizures have been generalized with loss of awareness. Remains on gabapentin 300 mg at bedtime for migraines, are doing well right now, has had more allergies and headaches lately, sometimes hard to differentiate. Hasn't had any Maxalt lately. Has had seizures since a child. She is compliant with CPAP.  HISTORY  Copied 09/22/2021 Dr. Rexene Alberts: Ms. Furney is a 61 year old right-handed woman with an underlying medical history of asthma, breast cancer, arthritis, anxiety, reflux disease, seizures, recurrent headaches and obesity with a BMI of over 6, who presents for follow-up consultation of her obstructive sleep apnea, after interim testing and starting AutoPap therapy.  The patient is unaccompanied today.  I first met her on 09/06/2021 at the request of Dr. Jannifer Franklin and Butler Denmark, NP, at which time she reported snoring and recurrent headaches including morning headaches as well as difficulty maintaining sleep.  She was advised to proceed with a sleep test,  she had a home sleep test on 09/28/1999 2020 which indicated moderate obstructive sleep apnea with an AHI of 19.4/h, O2 nadir 83%.  She was advised to proceed with AutoPap therapy at home.  Her set up date was 06/15/2021.   Today, 09/22/2021: I reviewed her AutoPap compliance data from 09/18/2021 through 09/20/2021, during which time she used her machine every night with percent used days greater than 4 hour at 93%, indicating excellent compliance with an average usage of 6 hours and 19 minutes, residual AHI at goal at 2.1/h, leak on the high side with a 95th percentile at  33.7 L/min, 95th percentile of pressure at 10.7 cm with a range of 6 cm to 12 cm with.  She reports having adjusted well to treatment but does report difficulty with the mask as she is a side sleeper and she uses a full facemask.  She does have nasal congestion.  Sometimes it is seasonal with a flareup of her allergy symptoms so she opted for full facemask.  She is working on weight loss and tries to stay active by walking.  She has benefited from treatment of her sleep apnea, in that her sleep quality and sleep consolidation have improved.  She is motivated to continue with treatment.  She has received a replacement set of supplies.  04/06/2021 SS: Ms. Vo is a 61 year old female with history of seizures and migraine headaches. Saw Dr. Rexene Alberts in Oct 2021 for sleep consult, sleep study was ordered.  Showed moderate OSA. Hasn't started Autopap, still waiting for machine, sent to [facility]. Has headache today due to lack of caffeine, only 1 cup of coffee so far. In March, problems with left shoulder, doing PT right now. Had injections, suggested surgery. Remains on gabapentin 300 mg at bedtime. In general headaches are doing well, no significant headaches. Allergies are horrible, wears masks. No seizures. Some numbness right 3rd and 4th finger on tips. Driving, lives with husband and roommate.  Here today unaccompanied.  REVIEW OF SYSTEMS: Out of a complete 14 system review of symptoms, the patient complains only of the following symptoms, and all other reviewed systems are negative.  See HPI  ALLERGIES: Allergies  Allergen Reactions   Sulfa Drugs Cross Reactors     HOME MEDICATIONS: Outpatient Medications Prior to Visit  Medication Sig Dispense Refill   albuterol (VENTOLIN HFA) 108 (90 Base) MCG/ACT inhaler Inhale 1 puff into the lungs as needed.      aspirin-acetaminophen-caffeine (EXCEDRIN MIGRAINE) 250-250-65 MG tablet Take 1 tablet by mouth every 6 (six) hours as needed for headache.      fluticasone (FLONASE) 50 MCG/ACT nasal spray Place 1 spray into both nostrils daily.     UNKNOWN TO PATIENT Take 1 Dose by mouth 2 (two) times daily.     gabapentin (NEURONTIN) 300 MG capsule Take 1 capsule (300 mg total) by mouth at bedtime. 30 capsule 11   rizatriptan (MAXALT) 10 MG tablet Take 1 tablet at the onset of migraine. May repeat in 2 hours if needed. Do not exceed 2 tablets in 24 hours. 10 tablet 5   No facility-administered medications prior to visit.    PAST MEDICAL HISTORY: Past Medical History:  Diagnosis Date   Asthma    Breast cancer ([place])    Cerebral palsy ([place])    Degenerative arthritis    Depression with anxiety    Gastroesophageal reflux disease    Localization-related (focal) (partial) epilepsy and epileptic syndromes with complex partial seizures, without mention of intractable epilepsy  Migraine without aura, without mention of intractable migraine without mention of status migrainosus    Obesity    Personal history of chemotherapy    Personal history of radiation therapy    Sleep apnea    father    PAST SURGICAL HISTORY: Past Surgical History:  Procedure Laterality Date   BREAST LUMPECTOMY Right 1998   BREAST LUMPECTOMY Left 2008   FOOT CAPSULE RELEASE W/ PERCUTANEOUS HEEL CORD LENGTHENING, TIBIAL TENDON TRANSFER Right    HERNIA REPAIR     OOPHORECTOMY Bilateral    ovaries remove      FAMILY HISTORY: Family History  Problem Relation Age of Onset   Seizures Mother    Stroke Mother    Hypertension Father    Diabetes Father    Obesity Sister     SOCIAL HISTORY: Social History   Socioeconomic History   Marital status: Married    Spouse name: Remo Lipps    Number of children: 0   Years of education: HS   Highest education level: Not on file  Occupational History   Occupation: Disabled   Tobacco Use   Smoking status: Never   Smokeless tobacco: Never  Substance and Sexual Activity   Alcohol use: No    Alcohol/week: 0.0 standard drinks    Drug use: No   Sexual activity: Yes  Other Topics Concern   Not on file  Social History Narrative   Patient is married to [place] and lives with him and roommate.    Patient has no children.    Patient is on disability.    Patient is left handed.    Patient has some community college.    Social Determinants of Health   Financial Resource Strain: Not on file  Food Insecurity: Not on file  Transportation Needs: Not on file  Physical Activity: Not on file  Stress: Not on file  Social Connections: Not on file  Intimate Partner Violence: Not on file   PHYSICAL EXAM  Vitals:   04/06/22 0918  BP: 130/89  Pulse: 70  Weight: 218 lb (98.9 kg)  Height: $Remove'5\' 3"'tswsgCn$  (1.6 m)   Body mass index is 38.62 kg/m.  Generalized: Well developed, in no acute distress  Neurological examination  Mentation: Alert oriented to time, place, history taking. Follows all commands speech and language fluent Cranial nerve II-XII: Pupils were equal round reactive to light. Extraocular movements were full, visual field were full on confrontational test. Facial sensation and strength were normal. Head turning and shoulder shrug  were normal and symmetric. Chew marks to right and left lateral tongue, more on the left. Motor: The motor testing reveals 5 over 5 strength of all 4 extremities. Good symmetric motor tone is noted throughout.  Sensory: Sensory testing is intact to soft touch on all 4 extremities. No evidence of extinction is noted.  Coordination: Cerebellar testing reveals good finger-nose-finger and heel-to-shin bilaterally.  Gait and station: Gait is normal.  Reflexes: Deep tendon reflexes are symmetric and normal bilaterally.   DIAGNOSTIC DATA (LABS, IMAGING, TESTING) - I reviewed patient records, labs, notes, testing and imaging myself where available.  Lab Results  Component Value Date   WBC 6.4 03/08/2011   HGB 13.0 03/08/2011   HCT 37.7 03/08/2011   MCV 91.4 03/08/2011   PLT 263 03/08/2011       Component Value Date/Time   NA 139 03/08/2011 1525   K 3.7 03/08/2011 1525   CL 108 03/08/2011 1525   CO2 24 03/08/2011 1525   GLUCOSE  99 03/08/2011 1525   BUN 7 03/08/2011 1525   CREATININE 0.50 03/08/2011 1525   CALCIUM 9.0 03/08/2011 1525   PROT 6.7 03/08/2011 1525   ALBUMIN 3.7 03/08/2011 1525   AST 24 03/08/2011 1525   ALT 24 03/08/2011 1525   ALKPHOS 51 03/08/2011 1525   BILITOT 0.7 03/08/2011 1525   GFRNONAA >60 04/24/2007 0934   GFRAA  04/24/2007 0934    >60        The eGFR has been calculated using the MDRD equation. This calculation has not been validated in all clinical   No results found for: CHOL, HDL, LDLCALC, LDLDIRECT, TRIG, CHOLHDL No results found for: HGBA1C No results found for: VITAMINB12 No results found for: TSH  Butler Denmark, AGNP-C, DNP 04/06/2022, 10:04 AM [facility] [address] [place], [place] [address] [phone]

## 2022-04-05 ENCOUNTER — Telehealth: Payer: Self-pay | Admitting: Neurology

## 2022-04-05 NOTE — Telephone Encounter (Signed)
Pt called stating that when she was with her father he informed her that she had blacked out and possibly had a seizure. Pt would like to to speak to the RN as soon as possible.  ?

## 2022-04-06 ENCOUNTER — Encounter: Payer: Self-pay | Admitting: Neurology

## 2022-04-06 ENCOUNTER — Ambulatory Visit: Payer: BC Managed Care – PPO | Admitting: Neurology

## 2022-04-06 VITALS — BP 130/89 | HR 70 | Ht 63.0 in | Wt 218.0 lb

## 2022-04-06 DIAGNOSIS — G43009 Migraine without aura, not intractable, without status migrainosus: Secondary | ICD-10-CM

## 2022-04-06 DIAGNOSIS — R351 Nocturia: Secondary | ICD-10-CM

## 2022-04-06 DIAGNOSIS — G40219 Localization-related (focal) (partial) symptomatic epilepsy and epileptic syndromes with complex partial seizures, intractable, without status epilepticus: Secondary | ICD-10-CM

## 2022-04-06 MED ORDER — GABAPENTIN 300 MG PO CAPS: 300.0000 mg | ORAL_CAPSULE | Freq: Every day | ORAL | 11 refills | Status: DC

## 2022-04-06 MED ORDER — RIZATRIPTAN BENZOATE 10 MG PO TABS: ORAL_TABLET | ORAL | 5 refills | Status: DC

## 2022-04-06 MED ORDER — OXCARBAZEPINE 150 MG PO TABS: ORAL_TABLET | ORAL | 5 refills | Status: DC

## 2022-04-06 NOTE — Patient Instructions (Addendum)
Meds ordered this encounter  Medications   OXcarbazepine (TRILEPTAL) 150 MG tablet    Sig: Take 1 tablet twice daily for 2 weeks, then take 2 tablets twice daily    Dispense:  120 tablet    Refill:  5   rizatriptan (MAXALT) 10 MG tablet    Sig: Take 1 tablet at the onset of migraine. May repeat in 2 hours if needed. Do not exceed 2 tablets in 24 hours.    Dispense:  10 tablet    Refill:  5   gabapentin (NEURONTIN) 300 MG capsule    Sig: Take 1 capsule (300 mg total) by mouth at bedtime.    Dispense:  30 capsule    Refill:  11   Start taking Trileptal 150 mg tablet twice daily x 2 weeks, then take 300 mg twice daily No driving until seizure free 6 months Check EEG, labs today Return back in 3-4 months with me or Dr. April Manson

## 2022-04-07 LAB — COMPREHENSIVE METABOLIC PANEL
ALT: 19 IU/L (ref 0–32)
AST: 21 IU/L (ref 0–40)
Albumin/Globulin Ratio: 2 (ref 1.2–2.2)
Albumin: 4.5 g/dL (ref 3.8–4.8)
Alkaline Phosphatase: 63 IU/L (ref 44–121)
BUN/Creatinine Ratio: 11 — ABNORMAL LOW (ref 12–28)
BUN: 7 mg/dL — ABNORMAL LOW (ref 8–27)
Bilirubin Total: 0.5 mg/dL (ref 0.0–1.2)
CO2: 22 mmol/L (ref 20–29)
Calcium: 9.5 mg/dL (ref 8.7–10.3)
Chloride: 103 mmol/L (ref 96–106)
Creatinine, Ser: 0.61 mg/dL (ref 0.57–1.00)
Globulin, Total: 2.3 g/dL (ref 1.5–4.5)
Glucose: 92 mg/dL (ref 70–99)
Potassium: 3.7 mmol/L (ref 3.5–5.2)
Sodium: 142 mmol/L (ref 134–144)
Total Protein: 6.8 g/dL (ref 6.0–8.5)
eGFR: 102 mL/min/{1.73_m2} (ref >59)

## 2022-04-07 LAB — CBC WITH DIFFERENTIAL/PLATELET
Basophils Absolute: 0 10*3/uL (ref 0.0–0.2)
Basos: 0 %
EOS (ABSOLUTE): 0 10*3/uL (ref 0.0–0.4)
Eos: 0 %
Hematocrit: 42.4 % (ref 34.0–46.6)
Hemoglobin: 14.3 g/dL (ref 11.1–15.9)
Immature Grans (Abs): 0 10*3/uL (ref 0.0–0.1)
Immature Granulocytes: 0 %
Lymphocytes Absolute: 2.6 10*3/uL (ref 0.7–3.1)
Lymphs: 28 %
MCH: 29.1 pg (ref 26.6–33.0)
MCHC: 33.7 g/dL (ref 31.5–35.7)
MCV: 86 fL (ref 79–97)
Monocytes Absolute: 0.9 10*3/uL (ref 0.1–0.9)
Monocytes: 10 %
Neutrophils Absolute: 5.8 10*3/uL (ref 1.4–7.0)
Neutrophils: 62 %
Platelets: 318 10*3/uL (ref 150–450)
RBC: 4.91 x10E6/uL (ref 3.77–5.28)
RDW: 13.2 % (ref 11.7–15.4)
WBC: 9.3 10*3/uL (ref 3.4–10.8)

## 2022-04-12 ENCOUNTER — Ambulatory Visit: Payer: BC Managed Care – PPO | Admitting: Neurology

## 2022-04-12 DIAGNOSIS — G40219 Localization-related (focal) (partial) symptomatic epilepsy and epileptic syndromes with complex partial seizures, intractable, without status epilepticus: Secondary | ICD-10-CM | POA: Diagnosis not present

## 2022-04-12 NOTE — Procedures (Signed)
    History:  61 year old woman with seizure disorder   EEG classification:  Awake and asleep  Description of the recording: The background rhythms of this recording consists of a fairly well modulated medium amplitude background activity of 9 Hz at best. As the record progresses, the patient initially is in the waking state, but appears to enter the early stage II sleep during the recording, with rudimentary sleep spindles and vertex sharp wave activity seen. During the wakeful state, photic stimulation is performed, and no abnormal responses were seen. Hyperventilation was also performed, no abnormal response seen. No epileptiform discharges seen during this recording. There was intermittent diffuse focal slowing. EKG monitor shows no evidence of cardiac rhythm abnormalities with a heart rate of 66.  Abnormality: Intermittent diffuse slowing   Impression: This is an abnormal EEG recording in the waking and sleeping state due to intermittent diffuse slowing which is consistent with a generalized brain dysfunction, nonspecific etiology.    Alric Ran, MD [facility]

## 2022-04-13 ENCOUNTER — Telehealth: Payer: Self-pay | Admitting: *Deleted

## 2022-04-13 NOTE — Telephone Encounter (Signed)
I spoke to the patient and provided her with the EEG results. She will continue oxcarbazepine, as prescribed. She asked about driving and I reviewed [place] law with her again. No driving until event-free for six months. She voiced understanding.

## 2022-04-13 NOTE — Telephone Encounter (Signed)
-----   Message from Suzzanne Cloud, NP sent at 04/13/2022  5:56 AM EDT ----- Please call the patient, EEG showed intermittent diffuse slowing, consistent with generalized brain dysfunction. There were no seizures captured during EEG. She should continue with current treatment plan on Trileptal. Please let me know is she is having any issues or seizures. Thanks!!  Impression: This is an abnormal EEG recording in the waking and sleeping state due to intermittent diffuse slowing which is consistent with a generalized brain dysfunction, nonspecific etiology.

## 2022-05-29 ENCOUNTER — Telehealth: Payer: Self-pay | Admitting: Neurology

## 2022-05-29 NOTE — Telephone Encounter (Signed)
Per epic, last refill for gabapentin was 04/06/2022 # 30 with 11 refills. I called [facility] and spoke with pharmacy tech. She confirmed refill are on file and last refill was 04/05/2022. She will prepare refill and then call pt once ready for pick up.

## 2022-05-29 NOTE — Telephone Encounter (Signed)
Pt is calling and requesting a refill on her gabapentin (NEURONTIN) 300 MG capsule. Pt would like prescription called in to [place], [place].

## 2022-07-10 ENCOUNTER — Encounter: Payer: Self-pay | Admitting: Neurology

## 2022-07-10 ENCOUNTER — Ambulatory Visit: Payer: BC Managed Care – PPO | Admitting: Neurology

## 2022-07-10 VITALS — BP 140/85 | HR 74 | Ht 63.0 in | Wt 223.0 lb

## 2022-07-10 DIAGNOSIS — G40219 Localization-related (focal) (partial) symptomatic epilepsy and epileptic syndromes with complex partial seizures, intractable, without status epilepticus: Secondary | ICD-10-CM

## 2022-07-10 DIAGNOSIS — Z5181 Encounter for therapeutic drug level monitoring: Secondary | ICD-10-CM | POA: Diagnosis not present

## 2022-07-10 MED ORDER — OXCARBAZEPINE 150 MG PO TABS: 300.0000 mg | ORAL_TABLET | Freq: Two times a day (BID) | ORAL | 5 refills | Status: DC

## 2022-07-10 NOTE — Patient Instructions (Addendum)
Continue with Trileptal 300 mg BID  Will check a Trileptal level and BMP today  Follow up in  a year or sooner if worse

## 2022-07-10 NOTE — Progress Notes (Signed)
Patient: Debbie Peck Date of Birth: [birth year]  Reason for Visit: Follow up History from: Patient Primary Neurologist: Dr. Rexene Alberts for sleep; Dr. April Manson for seizures  ASSESSMENT AND PLAN 61 y.o. year old female   Seizure Migraine Headache  OSA on CPAP -History of at least 10 years since last seizure, likely longer, last on carbamazepine 400 mg 12 hr tablet twice daily, stopped back in 2014 due to cost  -Had seizure yesterday 04/05/2022, seems typical event, witnessed by her elderly father so exact details are poor, but she had typical nausea as aura beforehand, chewing to tongue, fatigue afterward -Restarted on Trileptal 300 mg twice daily for seizure prevention, cleaner version of carbamazepine  -Check Trileptal level and BMP today -No driving until seizure free 6 months -Continue gabapentin at bedtime for migraine prevention, Maxalt PRN for acute headache treatment -She will remain on CPAP, we did not address this today, has follow-up in November with Dr. Rexene Alberts -Follow up in a year with Judson Roch   Meds ordered this encounter  Medications   OXcarbazepine (TRILEPTAL) 150 MG tablet    Sig: Take 2 tablets (300 mg total) by mouth 2 (two) times daily.    Dispense:  120 tablet    Refill:  5    HISTORY OF PRESENT ILLNESS: Today 07/10/22 Debbie Peck is here today for follow-up.  Since last visit we have restarted her Trileptal 300 mg twice daily, denies any side effects from the medication. She is tolerating the medication very well.  She reports she might of had a mild event last couple weeks after starting the meds. Patient reports that she was in the bed and suddenly felt like she was about to get out of bed and felt like she was falling down back to bed.  She is not sure if she was just waking up from sleep or if this was an actual seizure because she was by herself.  Denies any tongue biting, no other  concerning events.   INTERVAL HISTORY 04/06/22: Yesterday she was out with her  dad at [facility], he was driving her home, she blacked out in the car. Felt as wave of nausea, her eyes closed, there was no description of shaking or tensing, it was a 5 minute car ride, remembers feeing sick, then was back at home and felt tired. Has bite marks to both sides on tongue. It is sore today. Last seizure was before 2014, was on carbamazepine long acting 400 mg twice daily at that time, but she couldn't afford it so she stopped on her own. Under a lot of stress, caring for her parents in poor health. She is driving a car. In the past seizures have been generalized with loss of awareness. Remains on gabapentin 300 mg at bedtime for migraines, are doing well right now, has had more allergies and headaches lately, sometimes hard to differentiate. Hasn't had any Maxalt lately. Has had seizures since a child. She is compliant with CPAP.  HISTORY  Copied 09/22/2021 Dr. Rexene Alberts: Debbie Peck is a 61 year old right-handed woman with an underlying medical history of asthma, breast cancer, arthritis, anxiety, reflux disease, seizures, recurrent headaches and obesity with a BMI of over 14, who presents for follow-up consultation of her obstructive sleep apnea, after interim testing and starting AutoPap therapy.  The patient is unaccompanied today.  I first met her on 09/06/2021 at the request of Dr. Jannifer Franklin and Butler Denmark, NP, at which time she reported snoring and recurrent headaches including morning headaches as  well as difficulty maintaining sleep.  She was advised to proceed with a sleep test, she had a home sleep test on 09/28/1999 2020 which indicated moderate obstructive sleep apnea with an AHI of 19.4/h, O2 nadir 83%.  She was advised to proceed with AutoPap therapy at home.  Her set up date was 06/15/2021.   Today, 09/22/2021: I reviewed her AutoPap compliance data from 09/18/2021 through 09/20/2021, during which time she used her machine every night with percent used days greater than 4 hour at 93%, indicating  excellent compliance with an average usage of 6 hours and 19 minutes, residual AHI at goal at 2.1/h, leak on the high side with a 95th percentile at 33.7 L/min, 95th percentile of pressure at 10.7 cm with a range of 6 cm to 12 cm with.  She reports having adjusted well to treatment but does report difficulty with the mask as she is a side sleeper and she uses a full facemask.  She does have nasal congestion.  Sometimes it is seasonal with a flareup of her allergy symptoms so she opted for full facemask.  She is working on weight loss and tries to stay active by walking.  She has benefited from treatment of her sleep apnea, in that her sleep quality and sleep consolidation have improved.  She is motivated to continue with treatment.  She has received a replacement set of supplies.  04/06/2021 SS: Debbie Peck is a 62 year old female with history of seizures and migraine headaches. Saw Dr. Rexene Alberts in Oct 2021 for sleep consult, sleep study was ordered.  Showed moderate OSA. Hasn't started Autopap, still waiting for machine, sent to [facility]. Has headache today due to lack of caffeine, only 1 cup of coffee so far. In March, problems with left shoulder, doing PT right now. Had injections, suggested surgery. Remains on gabapentin 300 mg at bedtime. In general headaches are doing well, no significant headaches. Allergies are horrible, wears masks. No seizures. Some numbness right 3rd and 4th finger on tips. Driving, lives with husband and roommate.  Here today unaccompanied.  REVIEW OF SYSTEMS: Out of a complete 14 system review of symptoms, the patient complains only of the following symptoms, and all other reviewed systems are negative.  See HPI  ALLERGIES: Allergies  Allergen Reactions   Sulfa Drugs Cross Reactors     HOME MEDICATIONS: Outpatient Medications Prior to Visit  Medication Sig Dispense Refill   albuterol (VENTOLIN HFA) 108 (90 Base) MCG/ACT inhaler Inhale 1 puff into the lungs as needed.       aspirin-acetaminophen-caffeine (EXCEDRIN MIGRAINE) 250-250-65 MG tablet Take 1 tablet by mouth every 6 (six) hours as needed for headache.     fluticasone (FLONASE) 50 MCG/ACT nasal spray Place 1 spray into both nostrils daily.     gabapentin (NEURONTIN) 300 MG capsule Take 1 capsule (300 mg total) by mouth at bedtime. 30 capsule 11   rizatriptan (MAXALT) 10 MG tablet Take 1 tablet at the onset of migraine. May repeat in 2 hours if needed. Do not exceed 2 tablets in 24 hours. 10 tablet 5   UNKNOWN TO PATIENT Take 1 Dose by mouth 2 (two) times daily.     OXcarbazepine (TRILEPTAL) 150 MG tablet Take 1 tablet twice daily for 2 weeks, then take 2 tablets twice daily 120 tablet 5   No facility-administered medications prior to visit.    PAST MEDICAL HISTORY: Past Medical History:  Diagnosis Date   Asthma    Breast cancer ([place])  Cerebral palsy ([facility])    Degenerative arthritis    Depression with anxiety    Gastroesophageal reflux disease    Localization-related (focal) (partial) epilepsy and epileptic syndromes with complex partial seizures, without mention of intractable epilepsy    Migraine without aura, without mention of intractable migraine without mention of status migrainosus    Obesity    Personal history of chemotherapy    Personal history of radiation therapy    Sleep apnea    father    PAST SURGICAL HISTORY: Past Surgical History:  Procedure Laterality Date   BREAST LUMPECTOMY Right 1998   BREAST LUMPECTOMY Left 2008   FOOT CAPSULE RELEASE W/ PERCUTANEOUS HEEL CORD LENGTHENING, TIBIAL TENDON TRANSFER Right    HERNIA REPAIR     OOPHORECTOMY Bilateral    ovaries remove      FAMILY HISTORY: Family History  Problem Relation Age of Onset   Seizures Mother    Stroke Mother    Hypertension Father    Diabetes Father    Obesity Sister     SOCIAL HISTORY: Social History   Socioeconomic History   Marital status: Married    Spouse name: Remo Lipps    Number of children: 0    Years of education: HS   Highest education level: Not on file  Occupational History   Occupation: Disabled   Tobacco Use   Smoking status: Never   Smokeless tobacco: Never  Substance and Sexual Activity   Alcohol use: No    Alcohol/week: 0.0 standard drinks of alcohol   Drug use: No   Sexual activity: Yes  Other Topics Concern   Not on file  Social History Narrative   Patient is married to Veyo and lives with him and roommate.    Patient has no children.    Patient is on disability.    Patient is left handed.    Patient has some community college.    Social Determinants of Health   Financial Resource Strain: Not on file  Food Insecurity: Not on file  Transportation Needs: Not on file  Physical Activity: Not on file  Stress: Not on file  Social Connections: Not on file  Intimate Partner Violence: Not on file   PHYSICAL EXAM  Vitals:   07/10/22 1425  BP: (!) 140/85  Pulse: 74  Weight: 223 lb (101.2 kg)  Height: _0  (1.6 m)   Body mass index is 39.5 kg/m.  Generalized: Well developed, in no acute distress  Neurological examination  Mentation: Alert oriented to time, place, history taking. Follows all commands speech and language fluent Cranial nerve II-XII: Pupils were equal round reactive to light. Extraocular movements were full, visual field were full on confrontational test. Facial sensation and strength were normal. Head turning and shoulder shrug  were normal and symmetric. Chew marks to right and left lateral tongue, more on the left. Motor: The motor testing reveals 5 over 5 strength of all 4 extremities. Good symmetric motor tone is noted throughout.  Sensory: Sensory testing is intact to soft touch on all 4 extremities. No evidence of extinction is noted.  Coordination: Cerebellar testing reveals good finger-nose-finger and heel-to-shin bilaterally.  Gait and station: Gait is normal.  Reflexes: Deep tendon reflexes are symmetric and normal bilaterally.    DIAGNOSTIC DATA (LABS, IMAGING, TESTING) - I reviewed patient records, labs, notes, testing and imaging myself where available.  Lab Results  Component Value Date   WBC 9.3 04/06/2022   HGB 14.3 04/06/2022   HCT 42.4 04/06/2022  MCV 86 04/06/2022   PLT 318 04/06/2022      Component Value Date/Time   NA 142 04/06/2022 0959   K 3.7 04/06/2022 0959   CL 103 04/06/2022 0959   CO2 22 04/06/2022 0959   GLUCOSE 92 04/06/2022 0959   GLUCOSE 99 03/08/2011 1525   BUN 7 (L) 04/06/2022 0959   CREATININE 0.61 04/06/2022 0959   CALCIUM 9.5 04/06/2022 0959   PROT 6.8 04/06/2022 0959   ALBUMIN 4.5 04/06/2022 0959   AST 21 04/06/2022 0959   ALT 19 04/06/2022 0959   ALKPHOS 63 04/06/2022 0959   BILITOT 0.5 04/06/2022 0959   GFRNONAA >60 04/24/2007 0934   GFRAA  04/24/2007 0934    >60        The eGFR has been calculated using the MDRD equation. This calculation has not been validated in all clinical   No results found for: "CHOL", "HDL", "[place]", "LDLDIRECT", "TRIG", "CHOLHDL" No results found for: "HGBA1C" No results found for: "VITAMINB12" No results found for: "TSH"   Alric Ran, MD 07/10/2022, 4:01 PM [facility] [address] [place], [place] [address] [phone]

## 2022-07-12 LAB — BASIC METABOLIC PANEL
BUN/Creatinine Ratio: 12 (ref 12–28)
BUN: 9 mg/dL (ref 8–27)
CO2: 23 mmol/L (ref 20–29)
Calcium: 9.5 mg/dL (ref 8.7–10.3)
Chloride: 101 mmol/L (ref 96–106)
Creatinine, Ser: 0.75 mg/dL (ref 0.57–1.00)
Glucose: 95 mg/dL (ref 70–99)
Potassium: 4.2 mmol/L (ref 3.5–5.2)
Sodium: 138 mmol/L (ref 134–144)
eGFR: 91 mL/min/{1.73_m2} (ref >59)

## 2022-07-12 LAB — 10-HYDROXYCARBAZEPINE: Oxcarbazepine SerPl-Mcnc: 8 ug/mL — ABNORMAL LOW (ref 10–35)

## 2022-07-12 MED ORDER — OXCARBAZEPINE 600 MG PO TABS: 600.0000 mg | ORAL_TABLET | Freq: Two times a day (BID) | ORAL | 2 refills | Status: DC

## 2022-07-12 NOTE — Addendum Note (Signed)
Addended byAlric Ran on: 07/12/2022 09:27 AM   Modules accepted: Orders

## 2022-07-17 ENCOUNTER — Encounter: Payer: Self-pay | Admitting: Neurology

## 2022-07-17 NOTE — Progress Notes (Signed)
Left a message for patient informing her that her Oxcarbazepine level was low at 8, advised her to increase Oxcarbazepine to 600 mg twice daily. New prescription sent to the pharmacy.   Dr. April Manson

## 2022-09-28 ENCOUNTER — Encounter: Payer: Self-pay | Admitting: Neurology

## 2022-09-28 ENCOUNTER — Telehealth: Payer: Self-pay | Admitting: Neurology

## 2022-09-28 ENCOUNTER — Ambulatory Visit: Payer: BC Managed Care – PPO | Admitting: Neurology

## 2022-09-28 VITALS — BP 110/80 | HR 80 | Ht 63.0 in | Wt 223.8 lb

## 2022-09-28 DIAGNOSIS — G4733 Obstructive sleep apnea (adult) (pediatric): Secondary | ICD-10-CM

## 2022-09-28 NOTE — Telephone Encounter (Signed)
Pt is requesting call from RN regarding when she can drive again. States she had a seizure 6 months ago and wants to know when she can be released to drive. Best call back # [phone].

## 2022-09-28 NOTE — Patient Instructions (Addendum)
Please continue using your autoPAP regularly. While your insurance requires that you use PAP at least 4 hours each night on 70% of the nights, I recommend, that you not skip any nights and use it throughout the night if you can. Getting used to PAP and staying with the treatment long term does take time and patience and discipline. Untreated obstructive sleep apnea when it is moderate to severe can have an adverse impact on cardiovascular health and raise her risk for heart disease, arrhythmias, hypertension, congestive heart failure, stroke and diabetes. Untreated obstructive sleep apnea causes sleep disruption, nonrestorative sleep, and sleep deprivation. This can have an impact on your day to day functioning and cause daytime sleepiness and impairment of cognitive function, memory loss, mood disturbance, and problems focussing. Using PAP regularly can improve these symptoms.  Since the heating element in your autoPAP machine is showing an error message, please make an appointment with your DME provide to have the machine looked at.   We can see you in 1 year for sleep apnea recheck, you can see Butler Denmark, NP.

## 2022-09-28 NOTE — Telephone Encounter (Signed)
I called pt and advised she is cleared to drive 6 months post 9/41/7408 seizure. She denied any recent seizure activity.

## 2022-09-28 NOTE — Progress Notes (Signed)
Subjective:    Patient ID: Debbie Peck is a 61 y.o. female.  HPI    Interim history:   Debbie Peck is a 61 year old right-handed woman with an underlying medical history of asthma, breast cancer, arthritis, anxiety, reflux disease, seizures, recurrent headaches and obesity with a BMI of over 71, who presents for follow-up consultation of Debbie Peck obstructive sleep apnea, on AutoPap therapy.  The patient is unaccompanied today. I last saw Debbie Peck on 09/22/2021, at which time Debbie Peck had adjusted well to AutoPap therapy and Debbie Peck was compliant with treatment, and reported benefit from therapy.  Debbie Peck was advised to follow-up routinely in 1 year in sleep clinic.  Debbie Peck saw Butler Denmark, NP in the interim on 04/06/2022 after a recent seizure.  Debbie Peck was started on a new medication for Debbie Peck epilepsy.    Debbie Peck had a follow-up in the interim with Dr. April Manson on 07/10/2022, at which time Debbie Peck was advised to continue with Trileptal, Debbie Peck was advised not to drive for 6 months.  Today, 09/28/2022 reviewed Debbie Peck AutoPap compliance data from 08/28/2022 through 09/26/2022, which is a total of 30 days, during which time Debbie Peck used Debbie Peck machine 29 days with percent use days greater than 4 hours at 83%, indicating very good compliance with an average usage of 5 hours and 19 minutes, residual AHI at goal at 2.0/h, 95th percentile of pressure at 10.6 cm with a range of 6 to 12 cm with EPR of 3, leak on the higher side with the 95th percentile at 20.9 L/min.  Debbie Peck reports generally doing okay with Debbie Peck autoPAP. Has seen an error message a couple of times indicating that the heating element was not working. Debbie Peck has not had Debbie Peck DME provider look at the machine yet. Debbie Peck uses a [employer] and has been up to date with Debbie Peck supplies. Debbie Peck is currently not driving. Reports no recent Sz, since Debbie Peck saw Dr. April Manson.   The patient's allergies, current medications, family history, past medical history, past social history, past surgical history and problem list were reviewed and  updated as appropriate.    Previously:   I first met Debbie Peck on 09/06/2021 at the request of Dr. Jannifer Franklin and Butler Denmark, NP, at which time Debbie Peck reported snoring and recurrent headaches including morning headaches as well as difficulty maintaining sleep.  Debbie Peck was advised to proceed with a sleep test, Debbie Peck had a home sleep test on 09/28/1999 2020 which indicated moderate obstructive sleep apnea with an AHI of 19.4/h, O2 nadir 83%.  Debbie Peck was advised to proceed with AutoPap therapy at home.  Debbie Peck set up date was 06/15/2021.   I reviewed Debbie Peck AutoPap compliance data from 09/18/2021 through 09/20/2021, during which time Debbie Peck used Debbie Peck machine every night with percent used days greater than 4 hour at 93%, indicating excellent compliance with an average usage of 6 hours and 19 minutes, residual AHI at goal at 2.1/h, leak on the high side with a 95th percentile at 33.7 L/min, 95th percentile of pressure at 10.7 cm with a range of 6 cm to 12 cm with.      09/06/20: (Debbie Peck) reports snoring and recurrent headaches including morning headaches.  Debbie Peck has sleep disruption, difficulty maintaining sleep.  Debbie Peck goes to bed around 11:20 PM and rise time is between 6 and 7 AM.  Debbie Peck lives with Debbie Peck husband and a roommate.  Debbie Peck has several pets in the household.  Debbie Peck has had recurrent sinus problems and left eye infection recently.  Debbie Peck does not watch TV in  the bedroom and tries to read before falling asleep.  Debbie Peck has nocturia about once or twice per average night and has woken up with a headache on average 3 times a week.  Debbie Peck drinks caffeine in the form of coffee, 2 to 3 cups in the mornings and some green tea.  Debbie Peck drinks alcohol occasionally and is a non-smoker.  Debbie Peck is retired. Debbie Peck Epworth sleepiness score is 4 out of 24, fatigue severity score is 34 out of 63.  Debbie Peck has recently been started on gabapentin at bedtime.  Debbie Peck reports a family history of sleep apnea in Debbie Peck father and one nephew.   Debbie Peck Past Medical History Is Significant  For: Past Medical History:  Diagnosis Date   Asthma    Breast cancer ([place])    Cerebral palsy (HCC)    Degenerative arthritis    Depression with anxiety    Gastroesophageal reflux disease    Localization-related (focal) (partial) epilepsy and epileptic syndromes with complex partial seizures, without mention of intractable epilepsy    Migraine without aura, without mention of intractable migraine without mention of status migrainosus    Obesity    Personal history of chemotherapy    Personal history of radiation therapy    Sleep apnea    father    Debbie Peck Past Surgical History Is Significant For: Past Surgical History:  Procedure Laterality Date   BREAST LUMPECTOMY Right 1998   BREAST LUMPECTOMY Left 2008   FOOT CAPSULE RELEASE W/ PERCUTANEOUS HEEL CORD LENGTHENING, TIBIAL TENDON TRANSFER Right    HERNIA REPAIR     OOPHORECTOMY Bilateral    ovaries remove      Debbie Peck Family History Is Significant For: Family History  Problem Relation Age of Onset   Seizures Mother    Stroke Mother    Hypertension Father    Diabetes Father    Sleep apnea Father    Obesity Sister    Sleep apnea Nephew     Debbie Peck Social History Is Significant For: Social History   Socioeconomic History   Marital status: Married    Spouse name: Remo Lipps    Number of children: 0   Years of education: HS   Highest education level: Not on file  Occupational History   Occupation: Disabled   Tobacco Use   Smoking status: Never   Smokeless tobacco: Never  Vaping Use   Vaping Use: Never used  Substance and Sexual Activity   Alcohol use: Yes    Comment: OCC   Drug use: No   Sexual activity: Yes  Other Topics Concern   Not on file  Social History Narrative   Patient is married to [employer] and lives with him and roommate.    Patient has no children.    Patient is on disability.    Patient is left handed.    Patient has some community college.    Social Determinants of Health   Financial Resource Strain:  Not on file  Food Insecurity: Not on file  Transportation Needs: Not on file  Physical Activity: Not on file  Stress: Not on file  Social Connections: Not on file    Debbie Peck Allergies Are:  Allergies  Allergen Reactions   Sulfa Drugs Cross Reactors   :   Debbie Peck Current Medications Are:  Outpatient Encounter Medications as of 09/28/2022  Medication Sig   albuterol (VENTOLIN HFA) 108 (90 Base) MCG/ACT inhaler Inhale 1 puff into the lungs as needed.    aspirin-acetaminophen-caffeine (EXCEDRIN MIGRAINE) 250-250-65 MG tablet Take  1 tablet by mouth every 6 (six) hours as needed for headache.   fluticasone (FLONASE) 50 MCG/ACT nasal spray Place 1 spray into both nostrils daily.   gabapentin (NEURONTIN) 300 MG capsule Take 1 capsule (300 mg total) by mouth at bedtime.   oxcarbazepine (TRILEPTAL) 600 MG tablet Take 1 tablet (600 mg total) by mouth 2 (two) times daily.   rizatriptan (MAXALT) 10 MG tablet Take 1 tablet at the onset of migraine. May repeat in 2 hours if needed. Do not exceed 2 tablets in 24 hours.   UNKNOWN TO PATIENT Take 1 Dose by mouth 2 (two) times daily.   No facility-administered encounter medications on file as of 09/28/2022.  :  Review of Systems:  Out of a complete 14 point review of systems, all are reviewed and negative with the exception of these symptoms as listed below:  Review of Systems  Neurological:        Pt here for CPAP f/u  Pt wants to know how often Debbie Peck should maintenance   Debbie Peck CPAP machine   ESS:2     Objective:  Neurological Exam  Physical Exam Physical Examination:   Vitals:   09/28/22 1313  BP: 110/80  Pulse: 80    General Examination: The patient is a very pleasant 61 y.o. female in no acute distress. Debbie Peck appears well-developed and well-nourished and well groomed.   HEENT: Normocephalic, atraumatic, pupils are equal, round and reactive to light, extraocular tracking is good without limitation to gaze excursion or nystagmus noted. Hearing is  grossly intact. Face is symmetric with normal facial animation. Speech is clear with no dysarthria noted. There is no hypophonia. There is no lip, neck/head, jaw or voice tremor. Neck is supple with full range of passive and active motion. There are no carotid bruits on auscultation. Oropharynx exam reveals: mild mouth dryness, good dental hygiene and moderate airway crowding.  Tongue protrudes centrally in palate elevates symmetrically.     Chest: Clear to auscultation without wheezing, rhonchi or crackles noted.   Heart: S1+S2+0, regular and normal without murmurs, rubs or gallops noted.    Abdomen: Soft, non-tender and non-distended with normal bowel sounds appreciated on auscultation.   Extremities: There is no obvious edema in the distal lower extremities/ankles..     Skin: Warm and dry without trophic changes noted.    Musculoskeletal: exam reveals no obvious joint deformities.    Neurologically:  Mental status: The patient is awake, alert and oriented in all 4 spheres. Debbie Peck immediate and remote memory, attention, language skills and fund of knowledge are appropriate. There is no evidence of aphasia, agnosia, apraxia or anomia. Speech is clear with normal prosody and enunciation. Thought process is linear. Mood is normal and affect is normal.  Cranial nerves II - XII are as described above under HEENT exam.  Motor exam: Normal bulk, strength and tone is noted. There is no tremor, fine motor skills and coordination: grossly intact.  Cerebellar testing: No dysmetria or intention tremor. There is no truncal or gait ataxia.  Sensory exam: intact to light touch in the upper and lower extremities.  Gait, station and balance: Debbie Peck stands easily. No veering to one side is noted. No leaning to one side is noted. Posture is age-appropriate and stance is narrow based. Gait shows normal stride length and normal pace. No problems turning are noted.     Assessment and Plan:    In summary, Debbie Peck  is a 61 year old female with an underlying medical history  of asthma, breast cancer, arthritis, anxiety, reflux disease, seizures, recurrent headaches and obesity, who presents for follow-up consultation of Debbie Peck obstructive sleep apnea, which was deemed in the moderate range by home sleep testing on 09/27/2020.  Debbie Peck has been on AutoPap therapy, Debbie Peck is compliant with treatment, started treatment with AutoPap on 06/15/2021.  Debbie Peck is advised to continue with full compliance with AutoPap therapy, we will keep Debbie Peck settings the same.  Debbie Peck is advised to follow-up in sleep clinic to see one of our nurse practitioners in 1 year routinely, sooner if needed.  Debbie Peck has had some trouble with the heating element of the machine, Debbie Peck has seen some error message on it.  Debbie Peck is advised to make an appointment with Debbie Peck DME provider to get it looked at.  I answered all Debbie Peck questions today and Debbie Peck was in agreement with our plan.    I spent 20 minutes in total face-to-face time and in reviewing records during pre-charting, more than 50% of which was spent in counseling and coordination of care, reviewing test results, reviewing medications and treatment regimen and/or in discussing or reviewing the diagnosis of OSA, the prognosis and treatment options. Pertinent laboratory and imaging test results that were available during this visit with the patient were reviewed by me and considered in my medical decision making (see chart for details).

## 2022-10-10 ENCOUNTER — Telehealth: Payer: Self-pay | Admitting: Neurology

## 2022-10-10 NOTE — Telephone Encounter (Signed)
I received the CPAP download from DME, she just saw Dr. Rexene Alberts on September 30, 2022, she was compliant at that time.  There were no setting changes, she was having a heating element error she was going to discuss with her DME.  CPAP download below shows overall good compliance, minimal mask leak, AHI 2.0.  It does not appear any changes need to be made.  She was planning to follow-up with [place] in 1 year.

## 2022-11-24 ENCOUNTER — Other Ambulatory Visit: Payer: Self-pay | Admitting: Neurology

## 2023-03-22 ENCOUNTER — Other Ambulatory Visit: Payer: Self-pay | Admitting: Neurology

## 2023-03-22 ENCOUNTER — Telehealth: Payer: Self-pay | Admitting: Neurology

## 2023-03-22 MED ORDER — OXCARBAZEPINE 300 MG PO TABS: 300.0000 mg | ORAL_TABLET | Freq: Two times a day (BID) | ORAL | 3 refills | Status: DC

## 2023-03-22 MED ORDER — OXCARBAZEPINE 600 MG PO TABS: 600.0000 mg | ORAL_TABLET | Freq: Two times a day (BID) | ORAL | 2 refills | Status: DC

## 2023-03-22 NOTE — Telephone Encounter (Signed)
Sorry 900 mg twice daily. New Rx sent to pharmacy 600 mg and 300 mg for  total of 900 mg twice dail.y

## 2023-03-22 NOTE — Telephone Encounter (Signed)
FYI Pt's sister Toniann Fail) stated that pt had a seizure and fell and hit the back of head and missed one of her seizure pills this morning. Pt's sister wanted to make Dr. Teresa Coombs aware of this. Pt's sister was requesting an appointment with Dr. Teresa Coombs, however he is booking out a few months. I informed pt's sister that pt had an appointment down for 07/11/23 with Maralyn Sago and that I would add that appointment to the wait list

## 2023-03-22 NOTE — Telephone Encounter (Signed)
Please advise patient to increase trileptal to 600 mg twice daily. I will send new prescription.

## 2023-03-26 NOTE — Telephone Encounter (Signed)
Mychart msg sent addressing dosage change from Dr. Teresa Coombs.

## 2023-05-16 ENCOUNTER — Telehealth: Payer: Self-pay | Admitting: Neurology

## 2023-05-16 NOTE — Telephone Encounter (Signed)
Call to patient, she states she had a seizure yesterday with tremors in the hands, staring off, and it last about 8 minutes. She now has a headache that started after the seizure. I reviewed medications in list for headaches, but she wanted to talk to someone before taking any. She did not have a head strike, she reports not missing any medication doses but doesn't always take at the same time Education provided on medication compliance. Patient verbalized understanding. I also advised that headaches became worse or has another seizure to go to ER for evaluation. Explain that time length of seizure is long. Patient verbalized understanding and requests advice. Advised I would send to provider.

## 2023-05-16 NOTE — Telephone Encounter (Signed)
Pt reports that while taking her dog to a vet appointment she had what she calls an seizure like episode: eyes rolled, drifted off, tremors in hands and feet.  A friend that was with her said it lasted 5-8 mins and then pt came back to her self.  Pt did not go to PCP, urgent care or ER. Pt felt fine that night and this morning.  Pt now states since yesterday evening she has had a headache, please call. Pt was advised to go to ED if she feels she is worsening.

## 2023-05-17 NOTE — Telephone Encounter (Signed)
1ST ATTEMPT; Left msg to call back

## 2023-05-17 NOTE — Telephone Encounter (Signed)
Pt called in and I relayed information. Pt stated that she experience small amounts of nausea and stated that she would try ginger ale or a coke to see if that helps to settle stomach. She voiced gratitude and understanding

## 2023-06-07 ENCOUNTER — Encounter: Payer: Self-pay | Admitting: Neurology

## 2023-06-07 ENCOUNTER — Ambulatory Visit: Payer: BC Managed Care – PPO | Admitting: Neurology

## 2023-06-07 VITALS — BP 139/91 | HR 86 | Ht 63.0 in | Wt 204.5 lb

## 2023-06-07 DIAGNOSIS — G40219 Localization-related (focal) (partial) symptomatic epilepsy and epileptic syndromes with complex partial seizures, intractable, without status epilepticus: Secondary | ICD-10-CM

## 2023-06-07 DIAGNOSIS — G43009 Migraine without aura, not intractable, without status migrainosus: Secondary | ICD-10-CM

## 2023-06-07 MED ORDER — OXCARBAZEPINE 600 MG PO TABS: 600.0000 mg | ORAL_TABLET | Freq: Two times a day (BID) | ORAL | 2 refills | Status: DC

## 2023-06-07 MED ORDER — OXCARBAZEPINE 300 MG PO TABS: 300.0000 mg | ORAL_TABLET | Freq: Two times a day (BID) | ORAL | 3 refills | Status: DC

## 2023-06-07 MED ORDER — GABAPENTIN 300 MG PO CAPS: 300.0000 mg | ORAL_CAPSULE | Freq: Every day | ORAL | 1 refills | Status: DC

## 2023-06-07 MED ORDER — RIZATRIPTAN BENZOATE 10 MG PO TABS: ORAL_TABLET | ORAL | 5 refills | Status: DC

## 2023-06-07 NOTE — Patient Instructions (Signed)
Continue Trileptal 900 mg twice daily for seizure prevention,  Check labs today, [place] driving law is no driving until seizure free for 6 months. I would recommend seeing psychiatry for mood disorder.

## 2023-06-07 NOTE — Progress Notes (Signed)
Patient: Debbie Peck Date of Birth: [birth year]  Reason for Visit: Follow up History from: Patient, friend Primary Neurologist: Dr. Frances Furbish for sleep; Dr. Teresa Coombs for seizures  ASSESSMENT AND PLAN 62 y.o. year old female   Seizure Migraine Headache  OSA on CPAP -Last seizure May 2024, missed a dose of medication, under significant amount of family stress lately.  No driving until seizure-free 6 months. -Continue Trileptal 900 mg BID -Check routine labs today  -Take medication daily as prescribed not to miss any doses -Continue gabapentin 300 mg at bedtime for headache prevention, sleep  -Use Maxalt as needed for acute headache   -Remains on CPAP, will address at next visit  -I think she needs to follow up with psychiatry  -Follow up in November as scheduled   Prior History:  -History of at least 10 years since last seizure, likely longer, last on carbamazepine 400 mg 12 hr tablet twice daily, stopped back in 2014 due to cost  -Had seizure yesterday 04/05/2022, seems typical event, witnessed by her elderly father so exact details are poor, but she had typical nausea as aura beforehand, chewing to tongue, fatigue afterward  Orders Placed This Encounter  Procedures   CBC with Differential/Platelet   CMP   10-Hydroxycarbazepine   HISTORY OF PRESENT ILLNESS: Today 06/07/23 Saw Dr. Teresa Coombs in August 2023 Trileptal level was low at 8, Trileptal was increased 600 mg twice daily.  Saw Dr. Frances Furbish November 2023 for CPAP with excellent compliance.  Called in May 2024 reporting seizure, missed a dose of Trileptal.  Dr. Teresa Coombs increase Trileptal 900 mg twice daily. Here today with her friend, [name]. 1 month ago had a "huge" panic attack regarding illness of her dog. At the time was not taking her medications regularly. [name] has now taken over her medication. She has dissociative identity disorder reportedly. Migraines doing well. Uses Maxalt maybe once a month. Takes gabapentin 300 mg at bedtime  for migraine prevention and to help with sleep. Her parents are elderly, moving them into [place], has been some family conflict that has been stressful for her.   Update 07/10/22 Dr. Teresa Coombs: Debbie Peck is here today for follow-up.  Since last visit we have restarted her Trileptal 300 mg twice daily, denies any side effects from the medication. She is tolerating the medication very well.  She reports she might of had a mild event last couple weeks after starting the meds. Patient reports that she was in the bed and suddenly felt like she was about to get out of bed and felt like she was falling down back to bed.  She is not sure if she was just waking up from sleep or if this was an actual seizure because she was by herself.  Denies any tongue biting, no other  concerning events.  INTERVAL HISTORY 04/06/22: Yesterday she was out with her dad at [facility], he was driving her home, she blacked out in the car. Felt as wave of nausea, her eyes closed, there was no description of shaking or tensing, it was a 5 minute car ride, remembers feeing sick, then was back at home and felt tired. Has bite marks to both sides on tongue. It is sore today. Last seizure was before 2014, was on carbamazepine long acting 400 mg twice daily at that time, but she couldn't afford it so she stopped on her own. Under a lot of stress, caring for her parents in poor health. She is driving a car. In the past seizures  have been generalized with loss of awareness. Remains on gabapentin 300 mg at bedtime for migraines, are doing well right now, has had more allergies and headaches lately, sometimes hard to differentiate. Hasn't had any Maxalt lately. Has had seizures since a child. She is compliant with CPAP.  HISTORY  Copied 09/22/2021 Dr. Frances Furbish: Ms. Lenger is a 62 year old right-handed woman with an underlying medical history of asthma, breast cancer, arthritis, anxiety, reflux disease, seizures, recurrent headaches and obesity with a BMI of over  40, who presents for follow-up consultation of her obstructive sleep apnea, after interim testing and starting AutoPap therapy.  The patient is unaccompanied today.  I first met her on 09/06/2021 at the request of Dr. Anne Hahn and Margie Ege, NP, at which time she reported snoring and recurrent headaches including morning headaches as well as difficulty maintaining sleep.  She was advised to proceed with a sleep test, she had a home sleep test on 09/28/1999 2020 which indicated moderate obstructive sleep apnea with an AHI of 19.4/h, O2 nadir 83%.  She was advised to proceed with AutoPap therapy at home.  Her set up date was 06/15/2021.   Today, 09/22/2021: I reviewed her AutoPap compliance data from 09/18/2021 through 09/20/2021, during which time she used her machine every night with percent used days greater than 4 hour at 93%, indicating excellent compliance with an average usage of 6 hours and 19 minutes, residual AHI at goal at 2.1/h, leak on the high side with a 95th percentile at 33.7 L/min, 95th percentile of pressure at 10.7 cm with a range of 6 cm to 12 cm with.  She reports having adjusted well to treatment but does report difficulty with the mask as she is a side sleeper and she uses a full facemask.  She does have nasal congestion.  Sometimes it is seasonal with a flareup of her allergy symptoms so she opted for full facemask.  She is working on weight loss and tries to stay active by walking.  She has benefited from treatment of her sleep apnea, in that her sleep quality and sleep consolidation have improved.  She is motivated to continue with treatment.  She has received a replacement set of supplies.  04/06/2021 SS: Ms. Uffelman is a 62 year old female with history of seizures and migraine headaches. Saw Dr. Frances Furbish in Oct 2021 for sleep consult, sleep study was ordered.  Showed moderate OSA. Hasn't started Autopap, still waiting for machine, sent to [facility]. Has headache today due to lack of caffeine, only  1 cup of coffee so far. In March, problems with left shoulder, doing PT right now. Had injections, suggested surgery. Remains on gabapentin 300 mg at bedtime. In general headaches are doing well, no significant headaches. Allergies are horrible, wears masks. No seizures. Some numbness right 3rd and 4th finger on tips. Driving, lives with husband and roommate.  Here today unaccompanied.  REVIEW OF SYSTEMS: Out of a complete 14 system review of symptoms, the patient complains only of the following symptoms, and all other reviewed systems are negative.  See HPI  ALLERGIES: Allergies  Allergen Reactions   Sulfa Drugs Cross Reactors     HOME MEDICATIONS: Outpatient Medications Prior to Visit  Medication Sig Dispense Refill   albuterol (VENTOLIN HFA) 108 (90 Base) MCG/ACT inhaler Inhale 1 puff into the lungs as needed.      aspirin-acetaminophen-caffeine (EXCEDRIN MIGRAINE) 250-250-65 MG tablet Take 1 tablet by mouth every 6 (six) hours as needed for headache.     fluticasone (  FLONASE) 50 MCG/ACT nasal spray Place 1 spray into both nostrils daily.     gabapentin (NEURONTIN) 300 MG capsule TAKE 1 CAPSULE BY MOUTH AT BEDTIME. 60 capsule 3   Oxcarbazepine (TRILEPTAL) 300 MG tablet Take 1 tablet (300 mg total) by mouth 2 (two) times daily. To take with an additional 600 mg for a total of 900 mg twice daily 180 tablet 3   oxcarbazepine (TRILEPTAL) 600 MG tablet Take 1 tablet (600 mg total) by mouth 2 (two) times daily. To take with a addition of 300 for a total of 900 mg twice daily 180 tablet 2   rizatriptan (MAXALT) 10 MG tablet Take 1 tablet at the onset of migraine. May repeat in 2 hours if needed. Do not exceed 2 tablets in 24 hours. 10 tablet 5   UNKNOWN TO PATIENT Take 1 Dose by mouth 2 (two) times daily.     No facility-administered medications prior to visit.    PAST MEDICAL HISTORY: Past Medical History:  Diagnosis Date   Asthma    Breast cancer (HCC)    Cerebral palsy (HCC)     Degenerative arthritis    Depression with anxiety    Gastroesophageal reflux disease    Localization-related (focal) (partial) epilepsy and epileptic syndromes with complex partial seizures, without mention of intractable epilepsy    Migraine without aura, without mention of intractable migraine without mention of status migrainosus    Obesity    Personal history of chemotherapy    Personal history of radiation therapy    Sleep apnea    father    PAST SURGICAL HISTORY: Past Surgical History:  Procedure Laterality Date   BREAST LUMPECTOMY Right 1998   BREAST LUMPECTOMY Left 2008   FOOT CAPSULE RELEASE W/ PERCUTANEOUS HEEL CORD LENGTHENING, TIBIAL TENDON TRANSFER Right    HERNIA REPAIR     OOPHORECTOMY Bilateral    ovaries remove      FAMILY HISTORY: Family History  Problem Relation Age of Onset   Seizures Mother    Stroke Mother    Hypertension Father    Diabetes Father    Sleep apnea Father    Obesity Sister    Sleep apnea Nephew     SOCIAL HISTORY: Social History   Socioeconomic History   Marital status: Married    Spouse name: Viviann Spare    Number of children: 0   Years of education: HS   Highest education level: Not on file  Occupational History   Occupation: Disabled   Tobacco Use   Smoking status: Never   Smokeless tobacco: Never  Vaping Use   Vaping status: Never Used  Substance and Sexual Activity   Alcohol use: Yes    Comment: OCC   Drug use: No   Sexual activity: Yes  Other Topics Concern   Not on file  Social History Narrative   Patient is married to [place] and lives with him and roommate.    Patient has no children.    Patient is on disability.    Patient is left handed.    Patient has some community college.    Social Determinants of Health   Financial Resource Strain: Not on file  Food Insecurity: Not on file  Transportation Needs: Not on file  Physical Activity: Not on file  Stress: Not on file  Social Connections: Not on file   Intimate Partner Violence: Not on file   PHYSICAL EXAM  Vitals:   06/07/23 1311  BP: (!) 139/91  Pulse: 86  Weight: 204 lb 8 oz (92.8 kg)  Height: 5\' 3"  (1.6 m)   Body mass index is 36.23 kg/m.  Generalized: Well developed, in no acute distress  Neurological examination  Mentation: Alert oriented to time, place, history taking. Follows all commands speech and language fluent.  Frequently mentions family stress.  Has trouble staying on topic. Cranial nerve II-XII: Pupils were equal round reactive to light. Extraocular movements were full, visual field were full on confrontational test. Facial sensation and strength were normal. Head turning and shoulder shrug  were normal and symmetric.  Motor: The motor testing reveals 5 over 5 strength of all 4 extremities. Good symmetric motor tone is noted throughout.  Sensory: Sensory testing is intact to soft touch on all 4 extremities. No evidence of extinction is noted.  Coordination: Cerebellar testing reveals good finger-nose-finger and heel-to-shin bilaterally.  Gait and station: Gait is normal.  Reflexes: Deep tendon reflexes are symmetric and normal bilaterally.   DIAGNOSTIC DATA (LABS, IMAGING, TESTING) - I reviewed patient records, labs, notes, testing and imaging myself where available.  Lab Results  Component Value Date   WBC 9.3 04/06/2022   HGB 14.3 04/06/2022   HCT 42.4 04/06/2022   MCV 86 04/06/2022   PLT 318 04/06/2022      Component Value Date/Time   NA 138 07/10/2022 1517   K 4.2 07/10/2022 1517   CL 101 07/10/2022 1517   CO2 23 07/10/2022 1517   GLUCOSE 95 07/10/2022 1517   GLUCOSE 99 03/08/2011 1525   BUN 9 07/10/2022 1517   CREATININE 0.75 07/10/2022 1517   CALCIUM 9.5 07/10/2022 1517   PROT 6.8 04/06/2022 0959   ALBUMIN 4.5 04/06/2022 0959   AST 21 04/06/2022 0959   ALT 19 04/06/2022 0959   ALKPHOS 63 04/06/2022 0959   BILITOT 0.5 04/06/2022 0959   GFRNONAA >60 04/24/2007 0934   GFRAA  04/24/2007 0934     >60        The eGFR has been calculated using the MDRD equation. This calculation has not been validated in all clinical   No results found for: "CHOL", "HDL", "LDLCALC", "LDLDIRECT", "TRIG", "CHOLHDL" No results found for: "HGBA1C" No results found for: "VITAMINB12" No results found for: "TSH"  Debbie Kluver, DNP  [facility] [address] [place], [place] [address] [phone]

## 2023-06-08 LAB — COMPREHENSIVE METABOLIC PANEL
ALT: 15 IU/L (ref 0–32)
Alkaline Phosphatase: 77 IU/L (ref 44–121)
Bilirubin Total: <0.2 mg/dL (ref 0.0–1.2)
CO2: 23 mmol/L (ref 20–29)
Calcium: 9.2 mg/dL (ref 8.7–10.3)
Chloride: 96 mmol/L (ref 96–106)
Globulin, Total: 2.2 g/dL (ref 1.5–4.5)
Total Protein: 6.4 g/dL (ref 6.0–8.5)
eGFR: 105 mL/min/{1.73_m2} (ref >59)

## 2023-06-08 LAB — CBC WITH DIFFERENTIAL/PLATELET
Basophils Absolute: 0 10*3/uL (ref 0.0–0.2)
EOS (ABSOLUTE): 0 10*3/uL (ref 0.0–0.4)
Eos: 0 %
Hematocrit: 38.9 % (ref 34.0–46.6)
Hemoglobin: 13.1 g/dL (ref 11.1–15.9)
Immature Grans (Abs): 0 10*3/uL (ref 0.0–0.1)
Lymphocytes Absolute: 1.5 10*3/uL (ref 0.7–3.1)
Lymphs: 22 %
MCHC: 33.7 g/dL (ref 31.5–35.7)
Neutrophils Absolute: 4.7 10*3/uL (ref 1.4–7.0)
Neutrophils: 69 %
Platelets: 350 10*3/uL (ref 150–450)
RDW: 13.7 % (ref 11.7–15.4)

## 2023-06-11 LAB — CBC WITH DIFFERENTIAL/PLATELET
Basos: 0 %
Immature Granulocytes: 0 %
MCH: 29.4 pg (ref 26.6–33.0)
MCV: 87 fL (ref 79–97)
Monocytes Absolute: 0.6 10*3/uL (ref 0.1–0.9)
Monocytes: 9 %
RBC: 4.46 x10E6/uL (ref 3.77–5.28)
WBC: 6.9 10*3/uL (ref 3.4–10.8)

## 2023-06-11 LAB — COMPREHENSIVE METABOLIC PANEL
AST: 14 IU/L (ref 0–40)
Albumin: 4.2 g/dL (ref 3.9–4.9)
BUN/Creatinine Ratio: 14 (ref 12–28)
BUN: 7 mg/dL — ABNORMAL LOW (ref 8–27)
Creatinine, Ser: 0.51 mg/dL — ABNORMAL LOW (ref 0.57–1.00)
Glucose: 104 mg/dL — ABNORMAL HIGH (ref 70–99)
Potassium: 4.2 mmol/L (ref 3.5–5.2)
Sodium: 134 mmol/L (ref 134–144)

## 2023-06-11 LAB — 10-HYDROXYCARBAZEPINE: Oxcarbazepine SerPl-Mcnc: 30 ug/mL (ref 10–35)

## 2023-06-27 ENCOUNTER — Ambulatory Visit: Payer: BC Managed Care – PPO | Admitting: Neurology

## 2023-07-11 ENCOUNTER — Ambulatory Visit: Payer: BC Managed Care – PPO | Admitting: Neurology

## 2023-07-11 ENCOUNTER — Other Ambulatory Visit: Payer: Self-pay

## 2023-07-11 ENCOUNTER — Emergency Department (HOSPITAL_COMMUNITY)
Admission: EM | Admit: 2023-07-11 | Discharge: 2023-07-11 | Disposition: A | Discharge status: Home / Self Care | Payer: BC Managed Care – PPO | Attending: Emergency Medicine | Admitting: Emergency Medicine

## 2023-07-11 ENCOUNTER — Encounter (HOSPITAL_COMMUNITY): Payer: Self-pay | Admitting: Emergency Medicine

## 2023-07-11 DIAGNOSIS — R569 Unspecified convulsions: Secondary | ICD-10-CM | POA: Insufficient documentation

## 2023-07-11 HISTORY — DX: Unspecified convulsions: R56.9

## 2023-07-11 LAB — CBG MONITORING, ED: Glucose-Capillary: 123 mg/dL — ABNORMAL HIGH (ref 70–99)

## 2023-07-11 NOTE — Discharge Instructions (Signed)
Please let your your neurologist know that you had another seizure.  They may want to change her medications.  Please return for repeat event.

## 2023-07-11 NOTE — ED Provider Notes (Signed)
[name] AT [facility] Provider Note   CSN: [id number] Arrival date & time: 07/11/23  1956     History  Chief Complaint  Patient presents with   Seizures    Debbie Peck is a 62 y.o. female.  62 yo F with a cc of a seizure.  Patient has a history of seizures.  Had the worst seizure that the husband has ever seen.  Lasted for about 5 minutes sounds like full tonic-clonic jerking.  She is now back to her baseline.  She says she has otherwise been doing well not cough congestion or fever no difficulty eating or drinking no nausea vomiting or diarrhea.  No abdominal pain.  Denies injury in the seizure.  Says like she was making dinner and the husband noticed her and came over and gently placed her into a couch.  She did bite her tongue on both sides.   Seizures      Home Medications Prior to Admission medications   Medication Sig Start Date End Date Taking? Authorizing Provider  albuterol (VENTOLIN HFA) 108 (90 Base) MCG/ACT inhaler Inhale 1 puff into the lungs as needed.     [provider]  aspirin-acetaminophen-caffeine (EXCEDRIN MIGRAINE) [phone] MG tablet Take 1 tablet by mouth every 6 (six) hours as needed for headache.    [provider]  fluticasone (FLONASE) 50 MCG/ACT nasal spray Place 1 spray into both nostrils daily.    [provider]  gabapentin (NEURONTIN) 300 MG capsule Take 1 capsule (300 mg total) by mouth at bedtime. 06/07/23   Glean Salvo, NP  Oxcarbazepine (TRILEPTAL) 300 MG tablet Take 1 tablet (300 mg total) by mouth 2 (two) times daily. To take with an additional 600 mg for a total of 900 mg twice daily 06/07/23 06/01/24  Glean Salvo, NP  oxcarbazepine (TRILEPTAL) 600 MG tablet Take 1 tablet (600 mg total) by mouth 2 (two) times daily. To take with a addition of 300 for a total of 900 mg twice daily 06/07/23 03/03/24  Glean Salvo, NP  rizatriptan (MAXALT) 10 MG tablet Take 1 tablet at the onset  of migraine. May repeat in 2 hours if needed. Do not exceed 2 tablets in 24 hours. 06/07/23   Glean Salvo, NP  UNKNOWN TO PATIENT Take 1 Dose by mouth 2 (two) times daily.    [provider]      Allergies    Sulfa drugs cross reactors    Review of Systems   Review of Systems  Neurological:  Positive for seizures.    Physical Exam Updated Vital Signs BP (!) 154/90   Pulse 93   Temp 98.7 F (37.1 C) (Oral)   Resp 16   Ht 5\' 3"  (1.6 m)   Wt 92.5 kg   SpO2 100%   BMI 36.14 kg/m  Physical Exam Vitals and nursing note reviewed.  Constitutional:      General: She is not in acute distress.    Appearance: She is well-developed. She is not diaphoretic.  HENT:     Head: Normocephalic and atraumatic.  Eyes:     Pupils: Pupils are equal, round, and reactive to light.  Cardiovascular:     Rate and Rhythm: Normal rate and regular rhythm.     Heart sounds: No murmur heard.    No friction rub. No gallop.  Pulmonary:     Effort: Pulmonary effort is normal.     Breath sounds: No wheezing or rales.  Abdominal:     General: There is no distension.     Palpations: Abdomen is soft.     Tenderness: There is no abdominal tenderness.  Musculoskeletal:        General: No tenderness.     Cervical back: Normal range of motion and neck supple.  Skin:    General: Skin is warm and dry.  Neurological:     Mental Status: She is alert and oriented to person, place, and time.  Psychiatric:        Behavior: Behavior normal.     ED Results / Procedures / Treatments   Labs (all labs ordered are listed, but only abnormal results are displayed) Labs Reviewed  CBG MONITORING, ED - Abnormal; Notable for the following components:      Result Value   Glucose-Capillary 123 (*)    All other components within normal limits  CBG MONITORING, ED    EKG None  Radiology No results found.  Procedures Procedures    Medications Ordered in ED Medications - No data to display  ED  Course/ Medical Decision Making/ A&P                                 Medical Decision Making  62 yo F with a chief complaints of seizure.  Patient has a history of seizures and on my record review she has had quite a few breakthrough seizures.  I am not sure why she was so concerned about her seizure today.  She is back to her baseline.  States she is compliant with her medications though she is not sure what she takes.   I am not sure that she would benefit from any laboratory evaluation as she has no obvious complaints otherwise and is back to her baseline.  Will have her follow-up with her neurologist in the office.  9:19 PM:  I have discussed the diagnosis/risks/treatment options with the patient.  Evaluation and diagnostic testing in the emergency department does not suggest an emergent condition requiring admission or immediate intervention beyond what has been performed at this time.  They will follow up with PCP. We also discussed returning to the ED immediately if new or worsening sx occur. We discussed the sx which are most concerning (e.g., sudden worsening pain, fever, inability to tolerate by mouth) that necessitate immediate return. Medications administered to the patient during their visit and any new prescriptions provided to the patient are listed below.  Medications given during this visit Medications - No data to display   The patient appears reasonably screen and/or stabilized for discharge and I doubt any other medical condition or other [facility] requiring further screening, evaluation, or treatment in the ED at this time prior to discharge.          Final Clinical Impression(s) / ED Diagnoses Final diagnoses:  Seizure [name])    Rx / DC Orders ED Discharge Orders     None         Melene Plan, DO 07/11/23 2119

## 2023-07-11 NOTE — ED Triage Notes (Signed)
Pt BIB [facility] c/o seizure, per EMS seizure witnessed by family lasting 5 mins, v/s 184/117 upon EMS arrival, HR 130, 98%, CBG 104

## 2023-07-16 ENCOUNTER — Telehealth: Payer: Self-pay | Admitting: Neurology

## 2023-07-16 NOTE — Telephone Encounter (Signed)
Yes, out of pocket, unless she applies and gets approved for the cone assistance program.

## 2023-07-16 NOTE — Telephone Encounter (Signed)
Phone room: Please advise that the pt will be paying out of pocket unless they get approved for cone assistance program  Thanks,  Anabia Weatherwax

## 2023-07-16 NOTE — Telephone Encounter (Signed)
Dr. Teresa Coombs,  Wouldn't this mean that the pt would need to pay out of pocket? Thanks,  Production assistant, radio

## 2023-07-16 NOTE — Telephone Encounter (Signed)
Pt is asking for a call to discuss recent seizure activity

## 2023-07-16 NOTE — Telephone Encounter (Signed)
Pt states she will soon be without insurance and, she would like to know what can be done as far as assistance with her  Oxcarbazepine (TRILEPTAL)

## 2023-07-16 NOTE — Telephone Encounter (Signed)
Returned call to pt who stated their sz occurred 07/11/23 unable to tell me the duration. Husband called 911 and ems showed up. Pt stated that she chewed her tongue. Asked if she missed her sz medication and she stated that she didn't recall missing gabapentin dose but that she in fact missed daily trileptal. I asked her to please remember to take it as prescribed as lapses can cause breakthrough sz. She voiced gratitude and understanding of all discussed

## 2023-07-17 NOTE — Telephone Encounter (Signed)
Pt was called and the message from [place], CMA was relayed

## 2023-10-03 NOTE — Progress Notes (Unsigned)
Patient: Debbie Peck Date of Birth: Jan 17, 1961  Reason for Visit: Follow up History from: Patient, friend Primary Neurologist: Dr. Frances Furbish for sleep; Dr. Teresa Coombs for seizures  ASSESSMENT AND PLAN 62 y.o. year old female   Seizure Migraine Headache  OSA on CPAP Psychiatric disorder -Potential seizure in August 2024, unclear if panic attack?  Missed dose of medication?  Under a lot of family stress currently.  Prior, last seizure May 2024 missed dose of medication  -Referral to psychiatry for better mood management -We will continue Trileptal 900 mg BID, discussed the importance of medication compliance -No driving until seizure-free for 6 months -Continue gabapentin 300 mg at bedtime for headache prevention, sleep  -Use Maxalt as needed for acute headache   -Continue CPAP, continue current settings -Call for seizures, follow-up 1 year or sooner if needed  Meds ordered this encounter  Medications   oxcarbazepine (TRILEPTAL) 600 MG tablet    Sig: Take 1 tablet (600 mg total) by mouth 2 (two) times daily. To take with a addition of 300 for a total of 900 mg twice daily    Dispense:  180 tablet    Refill:  2    To take with a addition of 300 for a total of 900 mg twice daily   Oxcarbazepine (TRILEPTAL) 300 MG tablet    Sig: Take 1 tablet (300 mg total) by mouth 2 (two) times daily. To take with an additional 600 mg for a total of 900 mg twice daily    Dispense:  180 tablet    Refill:  3    To take with an additional 600 mg for a total of 900 mg twice daily   gabapentin (NEURONTIN) 300 MG capsule    Sig: Take 1 capsule (300 mg total) by mouth at bedtime.    Dispense:  90 capsule    Refill:  1   Prior History:  -History of at least 10 years since last seizure, likely longer, last on carbamazepine 400 mg 12 hr tablet twice daily, stopped back in 2014 due to cost  -Had seizure yesterday 04/05/2022, seems typical event, witnessed by her elderly father so exact details are poor, but  she had typical nausea as aura beforehand, chewing to tongue, fatigue afterward  Orders Placed This Encounter  Procedures   Ambulatory referral to Psychiatry   HISTORY OF PRESENT ILLNESS: Today 10/04/23 Here with friend, Debbie Peck (house mate). Has been under a lot of stress. Her other personalities have been coming out (43 and 62 year old), nothing gets done. Financial stress, her husband lives with them Debbie Peck), he has prostate cancer. Her parents are in assisted living, trying to work on their house. In ER for seizure 07/11/23, generalized seizure, bit her tongue- Debbie Peck believes this was a severe panic attack, unclear if she missed any medication? She called about concern for losing insurance, is now on Medicaid. Labs when I saw her in July Trileptal 30, CBC and CMP were okay. Now that she has Medicaid, can get a Veterinary surgeon. On Trileptal 600 +300 twice daily, gabapentin 300 mg at bedtime. Migraines are doing well, last was few months ago. CPAP data 09/04/23-10/03/23 29/30 days 97%, > 4 hours 80%, average usage days used 5 hours.6-12 cm.  Leak 29.2, AHI 1.0 FFM feeling well with CPAP  Update 06/07/23 SS: Saw Dr. Teresa Coombs in August 2023 Trileptal level was low at 8, Trileptal was increased 600 mg twice daily.  Saw Dr. Frances Furbish November 2023 for CPAP with excellent compliance.  Called in May 2024 reporting seizure, missed a dose of Trileptal.  Dr. Teresa Coombs increase Trileptal 900 mg twice daily. Here today with her friend, Debbie Peck. 1 month ago had a "huge" panic attack regarding illness of her dog. At the time was not taking her medications regularly. Debbie Peck has now taken over her medication. She has dissociative identity disorder reportedly. Migraines doing well. Uses Maxalt maybe once a month. Takes gabapentin 300 mg at bedtime for migraine prevention and to help with sleep. Her parents are elderly, moving them into AL, has been some family conflict that has been stressful for her.   Update 07/10/22 Dr. Teresa Coombs: Debbie Peck is here today for follow-up.  Since last visit we have restarted her Trileptal 300 mg twice daily, denies any side effects from the medication. She is tolerating the medication very well.  She reports she might of had a mild event last couple weeks after starting the meds. Patient reports that she was in the bed and suddenly felt like she was about to get out of bed and felt like she was falling down back to bed.  She is not sure if she was just waking up from sleep or if this was an actual seizure because she was by herself.  Denies any tongue biting, no other  concerning events.  INTERVAL HISTORY 04/06/22: Yesterday she was out with her dad at [facility], he was driving her home, she blacked out in the car. Felt as wave of nausea, her eyes closed, there was no description of shaking or tensing, it was a 5 minute car ride, remembers feeing sick, then was back at home and felt tired. Has bite marks to both sides on tongue. It is sore today. Last seizure was before 2014, was on carbamazepine long acting 400 mg twice daily at that time, but she couldn't afford it so she stopped on her own. Under a lot of stress, caring for her parents in poor health. She is driving a car. In the past seizures have been generalized with loss of awareness. Remains on gabapentin 300 mg at bedtime for migraines, are doing well right now, has had more allergies and headaches lately, sometimes hard to differentiate. Hasn't had any Maxalt lately. Has had seizures since a child. She is compliant with CPAP.  HISTORY  Copied 09/22/2021 Dr. Frances Furbish: Ms. Swickard is a 62 year old right-handed woman with an underlying medical history of asthma, breast cancer, arthritis, anxiety, reflux disease, seizures, recurrent headaches and obesity with a BMI of over 40, who presents for follow-up consultation of her obstructive sleep apnea, after interim testing and starting AutoPap therapy.  The patient is unaccompanied today.  I first met her on 09/06/2021  at the request of Dr. Anne Hahn and Margie Ege, NP, at which time she reported snoring and recurrent headaches including morning headaches as well as difficulty maintaining sleep.  She was advised to proceed with a sleep test, she had a home sleep test on 09/28/1999 2020 which indicated moderate obstructive sleep apnea with an AHI of 19.4/h, O2 nadir 83%.  She was advised to proceed with AutoPap therapy at home.  Her set up date was 06/15/2021.   Today, 09/22/2021: I reviewed her AutoPap compliance data from 09/18/2021 through 09/20/2021, during which time she used her machine every night with percent used days greater than 4 hour at 93%, indicating excellent compliance with an average usage of 6 hours and 19 minutes, residual AHI at goal at 2.1/h, leak on the high side with a  95th percentile at 33.7 L/min, 95th percentile of pressure at 10.7 cm with a range of 6 cm to 12 cm with.  She reports having adjusted well to treatment but does report difficulty with the mask as she is a side sleeper and she uses a full facemask.  She does have nasal congestion.  Sometimes it is seasonal with a flareup of her allergy symptoms so she opted for full facemask.  She is working on weight loss and tries to stay active by walking.  She has benefited from treatment of her sleep apnea, in that her sleep quality and sleep consolidation have improved.  She is motivated to continue with treatment.  She has received a replacement set of supplies.  04/06/2021 SS: Ms. Tarpinian is a 62 year old female with history of seizures and migraine headaches. Saw Dr. Frances Furbish in Oct 2021 for sleep consult, sleep study was ordered.  Showed moderate OSA. Hasn't started Autopap, still waiting for machine, sent to [facility]. Has headache today due to lack of caffeine, only 1 cup of coffee so far. In March, problems with left shoulder, doing PT right now. Had injections, suggested surgery. Remains on gabapentin 300 mg at bedtime. In general headaches are doing well,  no significant headaches. Allergies are horrible, wears masks. No seizures. Some numbness right 3rd and 4th finger on tips. Driving, lives with husband and roommate.  Here today unaccompanied.  REVIEW OF SYSTEMS: Out of a complete 14 system review of symptoms, the patient complains only of the following symptoms, and all other reviewed systems are negative.  See HPI  ALLERGIES: Allergies  Allergen Reactions   Sulfa Drugs Cross Reactors     HOME MEDICATIONS: Outpatient Medications Prior to Visit  Medication Sig Dispense Refill   albuterol (VENTOLIN HFA) 108 (90 Base) MCG/ACT inhaler Inhale 1 puff into the lungs as needed.      aspirin-acetaminophen-caffeine (EXCEDRIN MIGRAINE) 250-250-65 MG tablet Take 1 tablet by mouth every 6 (six) hours as needed for headache.     fluticasone (FLONASE) 50 MCG/ACT nasal spray Place 1 spray into both nostrils daily.     gabapentin (NEURONTIN) 300 MG capsule Take 1 capsule (300 mg total) by mouth at bedtime. 90 capsule 1   Oxcarbazepine (TRILEPTAL) 300 MG tablet Take 1 tablet (300 mg total) by mouth 2 (two) times daily. To take with an additional 600 mg for a total of 900 mg twice daily 180 tablet 3   oxcarbazepine (TRILEPTAL) 600 MG tablet Take 1 tablet (600 mg total) by mouth 2 (two) times daily. To take with a addition of 300 for a total of 900 mg twice daily 180 tablet 2   rizatriptan (MAXALT) 10 MG tablet Take 1 tablet at the onset of migraine. May repeat in 2 hours if needed. Do not exceed 2 tablets in 24 hours. 10 tablet 5   UNKNOWN TO PATIENT Take 1 Dose by mouth 2 (two) times daily.     No facility-administered medications prior to visit.    PAST MEDICAL HISTORY: Past Medical History:  Diagnosis Date   Asthma    Breast cancer (HCC)    Cerebral palsy (HCC)    Degenerative arthritis    Depression with anxiety    Gastroesophageal reflux disease    Localization-related (focal) (partial) epilepsy and epileptic syndromes with complex partial  seizures, without mention of intractable epilepsy    Migraine without aura, without mention of intractable migraine without mention of status migrainosus    Obesity    Personal history of  chemotherapy    Personal history of radiation therapy    Seizure (HCC)    Sleep apnea    father    PAST SURGICAL HISTORY: Past Surgical History:  Procedure Laterality Date   BREAST LUMPECTOMY Right 1998   BREAST LUMPECTOMY Left 2008   FOOT CAPSULE RELEASE W/ PERCUTANEOUS HEEL CORD LENGTHENING, TIBIAL TENDON TRANSFER Right    HERNIA REPAIR     OOPHORECTOMY Bilateral    ovaries remove      FAMILY HISTORY: Family History  Problem Relation Age of Onset   Seizures Mother    Stroke Mother    Hypertension Father    Diabetes Father    Sleep apnea Father    Obesity Sister    Sleep apnea Nephew     SOCIAL HISTORY: Social History   Socioeconomic History   Marital status: Married    Spouse name: Viviann Spare    Number of children: 0   Years of education: HS   Highest education level: Not on file  Occupational History   Occupation: Disabled   Tobacco Use   Smoking status: Never   Smokeless tobacco: Never  Vaping Use   Vaping status: Never Used  Substance and Sexual Activity   Alcohol use: Yes    Comment: OCC   Drug use: No   Sexual activity: Yes  Other Topics Concern   Not on file  Social History Narrative   Patient is married to [place] and lives with him and roommate.    Patient has no children.    Patient is on disability.    Patient is left handed.    Patient has some community college.    Social Determinants of Health   Financial Resource Strain: Not on file  Food Insecurity: Not on file  Transportation Needs: Not on file  Physical Activity: Not on file  Stress: Not on file  Social Connections: Not on file  Intimate Partner Violence: Not on file   PHYSICAL EXAM  Vitals:   10/04/23 1311 10/04/23 1312  BP: 125/82 139/87  Pulse: 87 97  Weight: 191 lb (86.6 kg)   Height:  5\' 3"  (1.6 m)     Body mass index is 33.83 kg/m.  Generalized: Well developed, in no acute distress  Neurological examination  Mentation: Alert oriented to time, place, history taking. Follows all commands speech and language fluent.  Frequently mentions family stress.  Has trouble staying on topic. Disheveled   Cranial nerve II-XII: Pupils were equal round reactive to light. Extraocular movements were full, visual field were full on confrontational test. Facial sensation and strength were normal. Head turning and shoulder shrug  were normal and symmetric.  Motor: The motor testing reveals 5 over 5 strength of all 4 extremities. Good symmetric motor tone is noted throughout.  Sensory: Sensory testing is intact to soft touch on all 4 extremities. No evidence of extinction is noted.  Coordination: Cerebellar testing reveals good finger-nose-finger and heel-to-shin bilaterally.  Gait and station: Gait is normal.  Reflexes: Deep tendon reflexes are symmetric and normal bilaterally.   DIAGNOSTIC DATA (LABS, IMAGING, TESTING) - I reviewed patient records, labs, notes, testing and imaging myself where available.  Lab Results  Component Value Date   WBC 6.9 06/07/2023   HGB 13.1 06/07/2023   HCT 38.9 06/07/2023   MCV 87 06/07/2023   PLT 350 06/07/2023      Component Value Date/Time   NA 134 06/07/2023 1346   K 4.2 06/07/2023 1346   CL 96 06/07/2023  1346   CO2 23 06/07/2023 1346   GLUCOSE 104 (H) 06/07/2023 1346   GLUCOSE 99 03/08/2011 1525   BUN 7 (L) 06/07/2023 1346   CREATININE 0.51 (L) 06/07/2023 1346   CALCIUM 9.2 06/07/2023 1346   PROT 6.4 06/07/2023 1346   ALBUMIN 4.2 06/07/2023 1346   AST 14 06/07/2023 1346   ALT 15 06/07/2023 1346   ALKPHOS 77 06/07/2023 1346   BILITOT <0.2 06/07/2023 1346   GFRNONAA >60 04/24/2007 0934   GFRAA  04/24/2007 0934    >60        The eGFR has been calculated using the MDRD equation. This calculation has not been validated in all clinical    No results found for: "CHOL", "HDL", "LDLCALC", "LDLDIRECT", "TRIG", "CHOLHDL" No results found for: "HGBA1C" No results found for: "VITAMINB12" No results found for: "TSH"  Otila Kluver, DNP  [facility] [address], [address] [place], [place] [address] [phone]

## 2023-10-04 ENCOUNTER — Ambulatory Visit: Payer: Medicaid Other | Admitting: Neurology

## 2023-10-04 ENCOUNTER — Encounter: Payer: Self-pay | Admitting: Neurology

## 2023-10-04 VITALS — BP 139/87 | HR 97 | Ht 63.0 in | Wt 191.0 lb

## 2023-10-04 DIAGNOSIS — G43009 Migraine without aura, not intractable, without status migrainosus: Secondary | ICD-10-CM

## 2023-10-04 DIAGNOSIS — F99 Mental disorder, not otherwise specified: Secondary | ICD-10-CM

## 2023-10-04 DIAGNOSIS — G4733 Obstructive sleep apnea (adult) (pediatric): Secondary | ICD-10-CM

## 2023-10-04 DIAGNOSIS — G40219 Localization-related (focal) (partial) symptomatic epilepsy and epileptic syndromes with complex partial seizures, intractable, without status epilepticus: Secondary | ICD-10-CM

## 2023-10-04 MED ORDER — OXCARBAZEPINE 600 MG PO TABS: 600.0000 mg | ORAL_TABLET | Freq: Two times a day (BID) | ORAL | 2 refills | Status: DC

## 2023-10-04 MED ORDER — GABAPENTIN 300 MG PO CAPS: 300.0000 mg | ORAL_CAPSULE | Freq: Every day | ORAL | 1 refills | Status: DC

## 2023-10-04 MED ORDER — OXCARBAZEPINE 300 MG PO TABS: 300.0000 mg | ORAL_TABLET | Freq: Two times a day (BID) | ORAL | 3 refills | Status: DC

## 2023-10-04 NOTE — Patient Instructions (Addendum)
Check labs today  Continue current medications Call for seizures [place] driving law, no driving until seizure free 6 months Referral to psychiatry  Follow up in 1 year

## 2023-10-07 LAB — COMPREHENSIVE METABOLIC PANEL
ALT: 12 [IU]/L (ref 0–32)
AST: 16 IU/L (ref 0–40)
Albumin: 4.3 g/dL (ref 3.9–4.9)
Alkaline Phosphatase: 92 [IU]/L (ref 44–121)
BUN/Creatinine Ratio: 12 (ref 12–28)
BUN: 6 mg/dL — ABNORMAL LOW (ref 8–27)
Bilirubin Total: <0.2 mg/dL (ref 0.0–1.2)
CO2: 22 mmol/L (ref 20–29)
Calcium: 9 mg/dL (ref 8.7–10.3)
Chloride: 92 mmol/L — ABNORMAL LOW (ref 96–106)
Creatinine, Ser: 0.5 mg/dL — ABNORMAL LOW (ref 0.57–1.00)
Globulin, Total: 2.2 g/dL (ref 1.5–4.5)
Glucose: 102 mg/dL — ABNORMAL HIGH (ref 70–99)
Potassium: 3.8 mmol/L (ref 3.5–5.2)
Sodium: 132 mmol/L — ABNORMAL LOW (ref 134–144)
Total Protein: 6.5 g/dL (ref 6.0–8.5)
eGFR: 106 mL/min/{1.73_m2} (ref >59)

## 2023-10-07 LAB — CBC WITH DIFFERENTIAL/PLATELET
Basophils Absolute: 0 10*3/uL (ref 0.0–0.2)
Basos: 0 %
EOS (ABSOLUTE): 0 10*3/uL (ref 0.0–0.4)
Eos: 0 %
Hematocrit: 40.6 % (ref 34.0–46.6)
Hemoglobin: 13.7 g/dL (ref 11.1–15.9)
Immature Grans (Abs): 0 10*3/uL (ref 0.0–0.1)
Immature Granulocytes: 0 %
Lymphocytes Absolute: 1.4 10*3/uL (ref 0.7–3.1)
Lymphs: 21 %
MCH: 30 pg (ref 26.6–33.0)
MCHC: 33.7 g/dL (ref 31.5–35.7)
MCV: 89 fL (ref 79–97)
Monocytes Absolute: 0.8 10*3/uL (ref 0.1–0.9)
Monocytes: 12 %
Neutrophils Absolute: 4.5 10*3/uL (ref 1.4–7.0)
Neutrophils: 67 %
Platelets: 344 10*3/uL (ref 150–450)
RBC: 4.57 x10E6/uL (ref 3.77–5.28)
RDW: 12.2 % (ref 11.7–15.4)
WBC: 6.7 10*3/uL (ref 3.4–10.8)

## 2023-10-07 LAB — 10-HYDROXYCARBAZEPINE: Oxcarbazepine SerPl-Mcnc: 40 ug/mL — ABNORMAL HIGH (ref 10–35)

## 2023-10-08 ENCOUNTER — Telehealth: Payer: Self-pay | Admitting: Neurology

## 2023-10-08 DIAGNOSIS — G40219 Localization-related (focal) (partial) symptomatic epilepsy and epileptic syndromes with complex partial seizures, intractable, without status epilepticus: Secondary | ICD-10-CM

## 2023-10-08 NOTE — Telephone Encounter (Signed)
Please call, sodium level is a little low at 132.  Can be side effect of Trileptal.  Would recommend we recheck this week or next to ensure not dropping any further.  Also, Trileptal level was a little higher than in the past, will recheck to establish accurate baseline. Ask if she can come first thing in the AM before taking her AM dosing (she can bring with her and take right after blood draw). Thanks  Orders Placed This Encounter  Procedures   Basic Metabolic Panel   [id number]

## 2023-12-05 ENCOUNTER — Other Ambulatory Visit: Payer: Self-pay | Admitting: Neurology

## 2024-06-26 ENCOUNTER — Telehealth: Payer: Self-pay

## 2024-06-26 NOTE — Telephone Encounter (Signed)
 Orders faxed [facility] for CPAP orders

## 2024-06-30 ENCOUNTER — Encounter (HOSPITAL_COMMUNITY): Payer: Self-pay

## 2024-06-30 ENCOUNTER — Other Ambulatory Visit: Payer: Self-pay

## 2024-06-30 ENCOUNTER — Emergency Department (HOSPITAL_COMMUNITY)
Admission: EM | Admit: 2024-06-30 | Discharge: 2024-06-30 | Disposition: A | Discharge status: Home / Self Care | Attending: Emergency Medicine | Admitting: Emergency Medicine

## 2024-06-30 DIAGNOSIS — R569 Unspecified convulsions: Secondary | ICD-10-CM | POA: Diagnosis present

## 2024-06-30 DIAGNOSIS — G40909 Epilepsy, unspecified, not intractable, without status epilepticus: Secondary | ICD-10-CM | POA: Diagnosis not present

## 2024-06-30 LAB — COMPREHENSIVE METABOLIC PANEL WITH GFR
ALT: 13 U/L (ref 0–44)
AST: 20 U/L (ref 15–41)
Albumin: 3.6 g/dL (ref 3.5–5.0)
Alkaline Phosphatase: 63 U/L (ref 38–126)
Anion gap: 13 (ref 5–15)
BUN: 6 mg/dL — ABNORMAL LOW (ref 8–23)
CO2: 19 mmol/L — ABNORMAL LOW (ref 22–32)
Calcium: 8.7 mg/dL — ABNORMAL LOW (ref 8.9–10.3)
Chloride: 93 mmol/L — ABNORMAL LOW (ref 98–111)
Creatinine, Ser: 0.5 mg/dL (ref 0.44–1.00)
GFR, Estimated: >60 mL/min (ref >60)
Glucose, Bld: 111 mg/dL — ABNORMAL HIGH (ref 70–99)
Potassium: 3.9 mmol/L (ref 3.5–5.1)
Sodium: 125 mmol/L — ABNORMAL LOW (ref 135–145)
Total Bilirubin: 0.4 mg/dL (ref 0.0–1.2)
Total Protein: 6.3 g/dL — ABNORMAL LOW (ref 6.5–8.1)

## 2024-06-30 LAB — CBC WITH DIFFERENTIAL/PLATELET
Abs Immature Granulocytes: 0.06 K/uL (ref 0.00–0.07)
Basophils Absolute: 0 K/uL (ref 0.0–0.1)
Basophils Relative: 0 %
Eosinophils Absolute: 0 K/uL (ref 0.0–0.5)
Eosinophils Relative: 0 %
HCT: 39.6 % (ref 36.0–46.0)
Hemoglobin: 13.5 g/dL (ref 12.0–15.0)
Immature Granulocytes: 1 %
Lymphocytes Relative: 13 %
Lymphs Abs: 1.2 K/uL (ref 0.7–4.0)
MCH: 30.1 pg (ref 26.0–34.0)
MCHC: 34.1 g/dL (ref 30.0–36.0)
MCV: 88.2 fL (ref 80.0–100.0)
Monocytes Absolute: 1 K/uL (ref 0.1–1.0)
Monocytes Relative: 11 %
Neutro Abs: 6.6 K/uL (ref 1.7–7.7)
Neutrophils Relative %: 75 %
Platelets: 288 K/uL (ref 150–400)
RBC: 4.49 MIL/uL (ref 3.87–5.11)
RDW: 12.9 % (ref 11.5–15.5)
WBC: 8.8 K/uL (ref 4.0–10.5)
nRBC: 0 % (ref 0.0–0.2)

## 2024-06-30 LAB — URINALYSIS, ROUTINE W REFLEX MICROSCOPIC
Bilirubin Urine: NEGATIVE
Glucose, UA: NEGATIVE mg/dL
Ketones, ur: NEGATIVE mg/dL
Nitrite: NEGATIVE
Protein, ur: 100 mg/dL — AB
Specific Gravity, Urine: 1.016 (ref 1.005–1.030)
pH: 5 (ref 5.0–8.0)

## 2024-06-30 LAB — CBG MONITORING, ED: Glucose-Capillary: 139 mg/dL — ABNORMAL HIGH (ref 70–99)

## 2024-06-30 MED ORDER — LACOSAMIDE 50 MG PO TABS
200.0000 mg | ORAL_TABLET | Freq: Once | ORAL | Status: AC
  Administered 2024-06-30 (×2): 200 mg via ORAL
  Filled 2024-06-30: qty 4

## 2024-06-30 MED ORDER — LACOSAMIDE 100 MG PO TABS: 100.0000 mg | ORAL_TABLET | Freq: Two times a day (BID) | ORAL | 0 refills | Status: DC

## 2024-06-30 NOTE — ED Notes (Signed)
 Assisted pt to restroom

## 2024-06-30 NOTE — ED Provider Notes (Signed)
 [place] EMERGENCY DEPARTMENT AT [facility] Provider Note   CSN: [id number] Arrival date & time: 06/30/24  1949     Patient presents with: Seizures   Debbie  Peck is a 63 y.o. female past medical history significant for seizure and behavior issues presents today for a witnessed seizure.  Patient is alert and oriented at this time.  Patient denies pain, loss of bowel or bladder control, head injury, or tongue bite.  Patient is repetitively saying I want to leave here.  Per EMS patient had seizure while sitting down in a chair and therefore did not fall.  Patient does not believe she missed any doses of her oxcarbazepine .    Seizures      Prior to Admission medications   Medication Sig Start Date End Date Taking? Authorizing Provider  Lacosamide  100 MG TABS Take 1 tablet (100 mg total) by mouth every 12 (twelve) hours. 06/30/24  Yes Saamir Armstrong N, PA-C  albuterol (VENTOLIN HFA) 108 (90 Base) MCG/ACT inhaler Inhale 1 puff into the lungs as needed.     [provider]  aspirin-acetaminophen-caffeine (EXCEDRIN MIGRAINE) 250-250-65 MG tablet Take 1 tablet by mouth every 6 (six) hours as needed for headache.    [provider]  fluticasone (FLONASE) 50 MCG/ACT nasal spray Place 1 spray into both nostrils daily.    [provider]  gabapentin  (NEURONTIN ) 300 MG capsule Take 1 capsule (300 mg total) by mouth at bedtime. 10/04/23   Gayland Lauraine PARAS, NP  Oxcarbazepine  (TRILEPTAL ) 300 MG tablet Take 1 tablet (300 mg total) by mouth 2 (two) times daily. To take with an additional 600 mg for a total of 900 mg twice daily 10/04/23 09/28/24  Gayland Lauraine PARAS, NP  oxcarbazepine  (TRILEPTAL ) 600 MG tablet Take 1 tablet (600 mg total) by mouth 2 (two) times daily. To take with a addition of 300 for a total of 900 mg twice daily 10/04/23 06/30/24  Gayland Lauraine PARAS, NP  rizatriptan  (MAXALT ) 10 MG tablet Take 1 tablet at the onset of migraine. May repeat in 2 hours if needed.  Do not exceed 2 tablets in 24 hours. 06/07/23   Gayland Lauraine PARAS, NP  UNKNOWN TO PATIENT Take 1 Dose by mouth 2 (two) times daily.    [provider]    Allergies: Sulfa drugs cross reactors    Review of Systems  Neurological:  Positive for seizures.    Updated Vital Signs BP (!) 156/96 (BP Location: Right Arm)   Pulse (!) 104   Temp 98 F (36.7 C)   Resp 16   Ht 5' 3 (1.6 m)   Wt 77.1 kg   SpO2 98%   BMI 30.11 kg/m   Physical Exam Vitals and nursing note reviewed.  Constitutional:      General: She is not in acute distress.    Appearance: Normal appearance. She is well-developed.  HENT:     Head: Normocephalic and atraumatic.     Right Ear: External ear normal.     Left Ear: External ear normal.     Mouth/Throat:     Mouth: Mucous membranes are moist.     Pharynx: Oropharynx is clear.  Eyes:     Extraocular Movements: Extraocular movements intact.     Conjunctiva/sclera: Conjunctivae normal.  Cardiovascular:     Rate and Rhythm: Normal rate and regular rhythm.     Heart sounds: Normal heart sounds. No murmur heard. Pulmonary:     Effort: Pulmonary effort is normal. No  respiratory distress.     Breath sounds: Normal breath sounds.  Abdominal:     Palpations: Abdomen is soft.     Tenderness: There is no abdominal tenderness.  Musculoskeletal:        General: No swelling.     Cervical back: Normal range of motion and neck supple.  Skin:    General: Skin is warm and dry.     Capillary Refill: Capillary refill takes less than 2 seconds.  Neurological:     General: No focal deficit present.     Mental Status: She is alert and oriented to person, place, and time.  Psychiatric:        Mood and Affect: Mood normal.     (all labs ordered are listed, but only abnormal results are displayed) Labs Reviewed  COMPREHENSIVE METABOLIC PANEL WITH GFR - Abnormal; Notable for the following components:      Result Value   Sodium 125 (*)    Chloride 93 (*)    CO2  19 (*)    Glucose, Bld 111 (*)    BUN 6 (*)    Calcium 8.7 (*)    Total Protein 6.3 (*)    All other components within normal limits  URINALYSIS, ROUTINE W REFLEX MICROSCOPIC - Abnormal; Notable for the following components:   APPearance HAZY (*)    Hgb urine dipstick MODERATE (*)    Protein, ur 100 (*)    Leukocytes,Ua SMALL (*)    Bacteria, UA RARE (*)    All other components within normal limits  CBG MONITORING, ED - Abnormal; Notable for the following components:   Glucose-Capillary 139 (*)    All other components within normal limits  CBC WITH DIFFERENTIAL/PLATELET    EKG: EKG Interpretation Date/Time:  Monday June 30 2024 20:02:51 EDT Ventricular Rate:  109 PR Interval:  181 QRS Duration:  87 QT Interval:  331 QTC Calculation: 446 R Axis:   -46  Text Interpretation: Sinus tachycardia Consider left atrial enlargement Left anterior fascicular block Since last tracing rate faster Confirmed by Cleotilde Rogue ([id number]) on 06/30/2024 8:05:35 PM  Radiology: No results found.   Procedures   Medications Ordered in the ED  lacosamide  (VIMPAT ) tablet 200 mg (has no administration in time range)                                    Medical Decision Making Amount and/or Complexity of Data Reviewed Labs: ordered.   This patient presents to the ED for concern of seizure differential diagnosis includes seizure, pseudoseizure, anxiety attack, Hypoglycemia, hyperglycemia  Additional history obtained   Additional history obtained from Electronic Medical Record External records from outside source obtained and reviewed including allergy notes   Lab Tests:  I Ordered, and personally interpreted labs.  The pertinent results include: CBC WNL, UA with moderate hemoglobin, small leukocytes, 100 protein, rare bacteria, hyponatremia at 125, decreased chloride at 93, decreased CO2 at 19, decreased bun at 9 EKG showing sinus tachycardia   Problem List / ED Course:  Dr.  Vanessa who recommended giving the patient 200 mg of Vimpat  while in ED and starting her on 100 mg twice daily.  Patient should stop taking Trileptal . Patient observed for multiple hours while in ED and no refractory seizure-like activity.  Patient given 200 mg Vimpat  per neurology recommendation.  Patient to be started on Vimpat  outpatient.  Patient to follow-up with [facility] neurology.  Patient's hyponatremia likely a side effect of Trileptal .  Patient given return precautions.  I feel patient safe for discharge at this time.     Final diagnoses:  Seizure [facility])    ED Discharge Orders          Ordered    Lacosamide  100 MG TABS  Every 12 hours        06/30/24 2259               Francis Ileana SAILOR, PA-C 06/30/24 2300    Cleotilde Rogue, MD 07/01/24 1251

## 2024-06-30 NOTE — Discharge Instructions (Addendum)
 Today you were seen for breakthrough seizure.  Please stop taking your Trileptal .  Please pick up your Vimpat  and start taking it as prescribed.  Please follow-up with [facility] as soon as possible for further evaluation workup.  Please return to the ED if you have any continued seizure-like activity.  Please do not operate a vehicle until you are 6 months seizure-free.  Seizure precautions: Per [place]  DMV statutes, patients with seizures are not allowed to drive until they have been seizure-free for six months and cleared by a physician  Use caution when using heavy equipment or power tools. Avoid working on ladders or at heights. Take showers instead of baths. Ensure the water temperature is not too high on the home water heater. Do not go swimming alone. Do not lock yourself in a room alone (i.e. bathroom). When caring for infants or small children, sit down when holding, feeding, or changing them to minimize risk of injury to the child in the event you have a seizure. Maintain good sleep hygiene. Avoid alcohol.  If patient has another seizure, call 911 and bring them back to the ED if: A.  The seizure lasts longer than 5 minutes.     B.  The patient doesn't wake shortly after the seizure or has new problems such as difficulty seeing, speaking or moving following the seizure C.  The patient was injured during the seizure D.  The patient has a temperature over 102 F (39C) E.  The patient vomited during the seizure and now is having trouble breathing   During the Seizure  - First, ensure adequate ventilation and place patients on the floor on their left side Loosen clothing around the neck and ensure the airway is patent. If the patient is clenching the teeth, do not force the mouth open with any object as this can cause severe damage - Remove all items from the surrounding that can be hazardous. The patient may be oblivious to what's happening and may not even know what he or she is doing. If the  patient is confused and wandering, either gently guide him/her away and block access to outside areas - Reassure the individual and be comforting - Call 911. In most cases, the seizure ends before EMS arrives. However, there are cases when seizures may last over 3 to 5 minutes. Or the individual may have developed breathing difficulties or severe injuries. If a pregnant patient or a person with diabetes develops a seizure, it is prudent to call an ambulance. - Finally, if the patient does not regain full consciousness, then call EMS. Most patients will remain confused for about 45 to 90 minutes after a seizure, so you must use judgment in calling for help. - Avoid restraints but make sure the patient is in a bed with padded side rails - Place the individual in a lateral position with the neck slightly flexed; this will help the saliva drain from the mouth and prevent the tongue from falling backward - Remove all nearby furniture and other hazards from the area - Provide verbal assurance as the individual is regaining consciousness - Provide the patient with privacy if possible - Call for help and start treatment as ordered by the caregiver   After the Seizure (Postictal Stage)  After a seizure, most patients experience confusion, fatigue, muscle pain and/or a headache. Thus, one should permit the individual to sleep. For the next few days, reassurance is essential. Being calm and helping reorient the person is also  of importance.  Most seizures are painless and end spontaneously. Seizures are not harmful to others but can lead to complications such as stress on the lungs, brain and the heart. Individuals with prior lung problems may develop labored breathing and respiratory distress.

## 2024-06-30 NOTE — ED Notes (Signed)
 Vimpat  given @ 2200. Unable to chart due to chart being locked

## 2024-06-30 NOTE — ED Notes (Signed)
 Pt browsing through tv channels. Stated that she did not want to be here and that she wanted to go home. Pt A&o x 4

## 2024-06-30 NOTE — ED Triage Notes (Signed)
 Pt bib EMS d/t reported witnessed seizure. Pt reportedly has been post ictal for longer than usual. She is alert during triage. Denies any pain

## 2024-07-01 ENCOUNTER — Encounter: Payer: Self-pay | Admitting: Neurology

## 2024-07-31 ENCOUNTER — Other Ambulatory Visit: Payer: Self-pay | Admitting: Neurology

## 2024-07-31 MED ORDER — LACOSAMIDE 100 MG PO TABS: 100.0000 mg | ORAL_TABLET | Freq: Two times a day (BID) | ORAL | 5 refills | Status: DC

## 2024-07-31 NOTE — Telephone Encounter (Signed)
 Pt came in to office and states she was in ER for a grand mal seizure, says she needs a refill on Lacosamide  100 mg. Also has some other concerns wants to discuss

## 2024-07-31 NOTE — Addendum Note (Signed)
 Addended by: ONEITA HOIST E on: 07/31/2024 11:40 AM   Modules accepted: Orders

## 2024-07-31 NOTE — Telephone Encounter (Signed)
 Returned call to pt who stated that she had a sz on 06/30/24. Pt stated that she missed 2 doses of her medication at that time. Pt didn't injure herself but stated that it was a a focal sz. Medications need refilled Requested Prescriptions   Pending Prescriptions Disp Refills   Lacosamide  100 MG TABS 60 tablet 0    Sig: Take 1 tablet (100 mg total) by mouth every 12 (twelve) hours.   Last seen 10/04/23 Next appt 10/07/24  Dispenses   Dispensed Days Supply Quantity Provider Pharmacy  LACOSAMIDE  100 MG TABS 07/01/2024 30 60 each Francis Ileana SAILOR, PA-C [facility] [email or web address]

## 2024-09-05 ENCOUNTER — Ambulatory Visit: Admitting: Neurology

## 2024-09-05 ENCOUNTER — Other Ambulatory Visit

## 2024-09-05 ENCOUNTER — Encounter: Payer: Self-pay | Admitting: Neurology

## 2024-09-05 VITALS — BP 134/79 | HR 85 | Ht 63.0 in | Wt 200.0 lb

## 2024-09-05 DIAGNOSIS — F4481 Dissociative identity disorder: Secondary | ICD-10-CM | POA: Diagnosis not present

## 2024-09-05 DIAGNOSIS — G40009 Localization-related (focal) (partial) idiopathic epilepsy and epileptic syndromes with seizures of localized onset, not intractable, without status epilepticus: Secondary | ICD-10-CM | POA: Diagnosis not present

## 2024-09-05 DIAGNOSIS — G43009 Migraine without aura, not intractable, without status migrainosus: Secondary | ICD-10-CM

## 2024-09-05 MED ORDER — GABAPENTIN 300 MG PO CAPS: 300.0000 mg | ORAL_CAPSULE | Freq: Every day | ORAL | 3 refills | Status: AC

## 2024-09-05 MED ORDER — LACOSAMIDE 100 MG PO TABS: 100.0000 mg | ORAL_TABLET | Freq: Two times a day (BID) | ORAL | 5 refills | Status: AC

## 2024-09-05 NOTE — Patient Instructions (Addendum)
 Good to meet you.  Have bloodwork done for CBC, CMP  Schedule MRI brain with and without contrast  3. Schedule 1-hour EEG  4. Continue Lacosamide  100mg  twice a day and Gabapentin  300mg  every night  5. Referral will be sent to [facility] for psychiatry and psychotherapy  6. Follow-up in 3 months, call for any changes   Seizure Precautions: 1. If medication has been prescribed for you to prevent seizures, take it exactly as directed.  Do not stop taking the medicine without talking to your doctor first, even if you have not had a seizure in a long time.   2. Avoid activities in which a seizure would cause danger to yourself or to others.  Don't operate dangerous machinery, swim alone, or climb in high or dangerous places, such as on ladders, roofs, or girders.  Do not drive unless your doctor says you may.  3. If you have any warning that you may have a seizure, lay down in a safe place where you can't hurt yourself.    4.  No driving for 6 months from last seizure, as per [place]  state law.   Please refer to the following link on the Epilepsy Foundation of America's website for more information: http://www.epilepsyfoundation.org/answerplace/Social/driving/drivingu.cfm   5.  Maintain good sleep hygiene. Avoid alcohol.  6.  Contact your doctor if you have any problems that may be related to the medicine you are taking.  7.  Call 911 and bring the patient back to the ED if:        A.  The seizure lasts longer than 5 minutes.       B.  The patient doesn't awaken shortly after the seizure  C.  The patient has new problems such as difficulty seeing, speaking or moving  D.  The patient was injured during the seizure  E.  The patient has a temperature over 102 F (39C)  F.  The patient vomited and now is having trouble breathing

## 2024-09-05 NOTE — Progress Notes (Signed)
 NEUROLOGY CONSULTATION NOTE  Debbie  Peck MRN: [id number] DOB: 1961/03/15  Referring provider: Dr. Redell Pinal (ER) Primary care provider: [facility] at [facility]  Reason for consult:  second opinion for seizures  Dear Dr Pinal:  Thank you for your kind referral of Debbie  Peck for consultation of the above symptoms. Although her history is well known to you, please allow me to reiterate it for the purpose of our medical record. The patient was accompanied to the clinic by her roommate Dasie who also provides collateral information. Records and images were personally reviewed where available.   HISTORY OF PRESENT ILLNESS: This is a 63 year old left-handed woman with a history of breast cancer, OSA, depression, dissociative identity disorder, cerebral palsy, migraines, presenting for second opinion for seizures. She was seen in [facility] from 2014 to 2024, last visit 09/2023. Her best friend Al is present to provide additional information. She lives with her husband and Al. She reports seizures started in infancy/shortly after, stopped in school age, then recurred in her mid-20s. She states I had what was classified as a grand mal. She would feel a rush like she is going to throw up, then lose consciousness with tongue bite. On her last visit with [facility] in 09/2023, she was on Trileptal  900mg  BID and Gabapentin  300mg  at bedtime (for migraine prevention, sleep). She was in the ER on 06/30/24, she was headed to the kitchen then just stopped, could not go anywhere, she was looking at her husband and rotated. He said she froze, she could hear him and felt him grabbing her, then she woke up to EMS. She denies any shaking, I just stopped. She reports these occurred twice in the past year where all of a sudden she is quiet and staring straight. In the ER, sodium was 125. She was switched from Oxcarbazepine  to Lacosamide  100mg  BID. She states she had something happen  yesterday but they were not sure about it, she was talking to her husband then it felt like someone taking an eraserboard going across my brain. She could see but could not focus or communicate, she was pointing to where to get the plate but not talking. She felt weird after like she was sick. She was dizzy and off balance most of the day, sick to her stomach. She had a minor headache prior to the episode but it worsened after, mostly on the left side. She kept feeling like she would throw up. This was a new symptom.   She denies any side effects on Lacosamide . Al makes sure she takes her medications. Al has been living with them for 15 years and has not witnessed any convulsions. They reports she has Dissociative Identity Disorder and panic attacks, Al notes sometimes it is hard to differentiate panic attacks versus seizure. He has not identified any triggers, but notes when she gets stressed about different things, episodes seem to happen. She does not stare off with them so he is not sure if it is a panic attack. Sometimes she feels off balance. She reports a possible panic attack at a wedding they attended in June where she lost time. Afterwards, she has a metallic taste in her mouth. She reports grinding teeth at night, she used to have nocturnal seizures but Al has not seen any.   She has infrequent migraines, pain is just everywhere with nausea, vomiting, dry heaving. She lays in a dark room with photo/phonophobia. She had dizziness for most of the day yesterday, better  now. She reports the cerebral palsy affects her right side, limbs are shorter on the right and feel weaker all the time. No numbness/tingling. She denies any bowel/bladder dysfunction. She thinks she gets 6-7 hours of sleep with her CPAP machine even if her watch says 4.5-5 hours. Mood today is bouncy, it is usually pretty good. She states she has an issues with really bad mood swings and depression. She has not seen Psychiatry, Al  notes she needs to see Psych. They were unhappy with her prior psychiatrist, last visit was 5-6 years ago, he states one of her other personalities had taken over and he was completely unaware. There are 5 different identities, a 63yo (Suzie), 63yo, 3 adults (bossy lady, Macario, and you, Aveyah ). The last time she saw a therapist was 7-8 years ago. She takes Gabapentin  300mg  at bedtime for sleep but still wakes up in the middle of the night.   Epilepsy Risk Factors:  Cerebral palsy. 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 She denies any prior history of trauma (head trauma or other forms of trauma). There is no history of febrile convulsions, CNS infections such as meningitis/encephalitis, significant traumatic brain injury, neurosurgical procedures, or family history of seizures.  Prior ASMs: carbamazepine , oxcarbazepine   Diagnostic Data: EEGs: EEG in 03/2022 at [facility] reported intermittent diffuse slowing  MRI: none available for review    PAST MEDICAL HISTORY: Past Medical History:  Diagnosis Date   Asthma    Breast cancer (HCC)    Cerebral palsy (HCC)    Degenerative arthritis    Depression with anxiety    Gastroesophageal reflux disease    Localization-related (focal) (partial) epilepsy and epileptic syndromes with complex partial seizures, without mention of intractable epilepsy    Migraine without aura, without mention of intractable migraine without mention of status migrainosus    Obesity    Personal history of chemotherapy    Personal history of radiation therapy    Seizure (HCC)    Sleep apnea    father    PAST SURGICAL HISTORY: Past Surgical History:  Procedure Laterality Date   BREAST LUMPECTOMY Right 1998   BREAST LUMPECTOMY Left 2008   FOOT CAPSULE RELEASE W/ PERCUTANEOUS HEEL CORD LENGTHENING, TIBIAL TENDON TRANSFER Right    HERNIA REPAIR     OOPHORECTOMY Bilateral    ovaries remove      MEDICATIONS: Current Outpatient Medications on File Prior to  Visit  Medication Sig Dispense Refill   ADVAIR DISKUS 250-50 MCG/ACT AEPB Inhale 1 puff into the lungs 2 (two) times daily.     albuterol (VENTOLIN HFA) 108 (90 Base) MCG/ACT inhaler Inhale 1 puff into the lungs as needed.      aspirin-acetaminophen-caffeine (EXCEDRIN MIGRAINE) 250-250-65 MG tablet Take 1 tablet by mouth every 6 (six) hours as needed for headache.     azelastine (OPTIVAR) 0.05 % ophthalmic solution 1 drop 2 (two) times daily.     fluticasone (FLONASE) 50 MCG/ACT nasal spray Place 1 spray into both nostrils daily.     gabapentin  (NEURONTIN ) 300 MG capsule Take 1 capsule (300 mg total) by mouth at bedtime. 90 capsule 1   Lacosamide  100 MG TABS Take 1 tablet (100 mg total) by mouth every 12 (twelve) hours. 60 tablet 5   rizatriptan  (MAXALT ) 10 MG tablet Take 1 tablet at the onset of migraine. May repeat in 2 hours if needed. Do not exceed 2 tablets in 24 hours. 10 tablet 5   UNKNOWN TO PATIENT Take 1 Dose by  mouth 2 (two) times daily.     Oxcarbazepine  (TRILEPTAL ) 300 MG tablet Take 1 tablet (300 mg total) by mouth 2 (two) times daily. To take with an additional 600 mg for a total of 900 mg twice daily (Patient not taking: Reported on 09/05/2024) 180 tablet 3   oxcarbazepine  (TRILEPTAL ) 600 MG tablet Take 1 tablet (600 mg total) by mouth 2 (two) times daily. To take with a addition of 300 for a total of 900 mg twice daily (Patient not taking: Reported on 09/05/2024) 180 tablet 2   No current facility-administered medications on file prior to visit.    ALLERGIES: Allergies  Allergen Reactions   Sulfa Drugs Cross Reactors     FAMILY HISTORY: Family History  Problem Relation Age of Onset   Seizures Mother    Stroke Mother    Hypertension Father    Diabetes Father    Sleep apnea Father    Obesity Sister    Sleep apnea Nephew     SOCIAL HISTORY: Social History   Socioeconomic History   Marital status: Married    Spouse name: Elspeth    Number of children: 0   Years  of education: HS   Highest education level: Not on file  Occupational History   Occupation: Disabled   Tobacco Use   Smoking status: Never   Smokeless tobacco: Never  Vaping Use   Vaping status: Never Used  Substance and Sexual Activity   Alcohol use: Yes    Comment: OCC   Drug use: No   Sexual activity: Yes  Other Topics Concern   Not on file  Social History Narrative   Patient is married to [place] and lives with him and roommate.    Patient has no children.    Patient is on disability.    Patient is left handed.    Patient has some community college.    Social Drivers of Corporate Investment Banker Strain: Not on file  Food Insecurity: Not on file  Transportation Needs: Not on file  Physical Activity: Not on file  Stress: Not on file  Social Connections: Not on file  Intimate Partner Violence: Not on file     PHYSICAL EXAM: Vitals:   09/05/24 1033  BP: 134/79  Pulse: 85  SpO2: 97%   General: No acute distress Head:  Normocephalic/atraumatic Skin/Extremities: No rash, no edema Neurological Exam: Mental status: alert and awake, no dysarthria or aphasia, Fund of knowledge is appropriate.  Attention and concentration are normal.   Cranial nerves: CN I: not tested CN II: pupils equal, round, visual fields intact CN III, IV, VI:  full range of motion, no nystagmus, no ptosis CN V: facial sensation intact CN VII: upper and lower face symmetric CN VIII: hearing intact to conversation Bulk & Tone: normal, no fasciculations. Motor: 5/5 throughout with no pronator drift. Sensation: intact to light touch, cold, pin, vibration sense.  No extinction to double simultaneous stimulation.  Romberg test negative Deep Tendon Reflexes: +1 throughout Cerebellar: no incoordination on finger to nose testing Gait: slow and cautious, no ataxia Tremor: none    IMPRESSION: This is a 63 year old left-handed woman with a history of breast cancer, OSA, depression, dissociative  identity disorder, cerebral palsy, migraines, presenting for second opinion for seizures. She has not had any convulsions since possibly her 31s, witnesses report staring episodes. MRI brain with and without contrast and 1-hour EEG will be ordered for characterization. Continue Lacosamide  100mg  BID and Gabapentin  300mg  at  bedtime. Safety labs (CBC, CMP) will be ordered, sodium level was low on last bloodwork. She will be referred to [facility] for psychiatry and psychotherapy evaluation and treatment. She does not drive. Follow-up in 3 months, call for any changes.    Thank you for allowing me to participate in the care of this patient. Please do not hesitate to call for any questions or concerns.   Darice Shivers, M.D.  CC: Dr. Cleotilde, [facility] at [facility]

## 2024-09-06 LAB — CBC WITH DIFFERENTIAL/PLATELET
Absolute Lymphocytes: 2555 {cells}/uL (ref 850–3900)
Absolute Monocytes: 859 {cells}/uL (ref 200–950)
Basophils Absolute: 20 {cells}/uL (ref 0–200)
Basophils Relative: 0.2 %
Eosinophils Absolute: 10 {cells}/uL — ABNORMAL LOW (ref 15–500)
Eosinophils Relative: 0.1 %
HCT: 42.1 % (ref 35.0–45.0)
Hemoglobin: 14.2 g/dL (ref 11.7–15.5)
MCH: 30.7 pg (ref 27.0–33.0)
MCHC: 33.7 g/dL (ref 32.0–36.0)
MCV: 91.1 fL (ref 80.0–100.0)
MPV: 9.6 fL (ref 7.5–12.5)
Monocytes Relative: 8.5 %
Neutro Abs: 6656 {cells}/uL (ref 1500–7800)
Neutrophils Relative %: 65.9 %
Platelets: 334 Thousand/uL (ref 140–400)
RBC: 4.62 Million/uL (ref 3.80–5.10)
RDW: 12.5 % (ref 11.0–15.0)
Total Lymphocyte: 25.3 %
WBC: 10.1 Thousand/uL (ref 3.8–10.8)

## 2024-09-06 LAB — COMPREHENSIVE METABOLIC PANEL WITH GFR
AG Ratio: 1.7 (calc) (ref 1.0–2.5)
ALT: 13 U/L (ref 6–29)
AST: 13 U/L (ref 10–35)
Albumin: 4.3 g/dL (ref 3.6–5.1)
Alkaline phosphatase (APISO): 61 U/L (ref 37–153)
BUN: 11 mg/dL (ref 7–25)
CO2: 26 mmol/L (ref 20–32)
Calcium: 9.4 mg/dL (ref 8.6–10.4)
Chloride: 105 mmol/L (ref 98–110)
Creat: 0.63 mg/dL (ref 0.50–1.05)
Globulin: 2.6 g/dL (ref 1.9–3.7)
Glucose, Bld: 86 mg/dL (ref 65–99)
Potassium: 3.9 mmol/L (ref 3.5–5.3)
Sodium: 141 mmol/L (ref 135–146)
Total Bilirubin: 0.3 mg/dL (ref 0.2–1.2)
Total Protein: 6.9 g/dL (ref 6.1–8.1)
eGFR: 100 mL/min/1.73m2 (ref >=60)

## 2024-09-07 ENCOUNTER — Ambulatory Visit: Payer: Self-pay | Admitting: Neurology

## 2024-09-08 ENCOUNTER — Ambulatory Visit: Admitting: Neurology

## 2024-09-08 DIAGNOSIS — G40009 Localization-related (focal) (partial) idiopathic epilepsy and epileptic syndromes with seizures of localized onset, not intractable, without status epilepticus: Secondary | ICD-10-CM

## 2024-09-08 DIAGNOSIS — G43009 Migraine without aura, not intractable, without status migrainosus: Secondary | ICD-10-CM | POA: Diagnosis not present

## 2024-09-08 DIAGNOSIS — F4481 Dissociative identity disorder: Secondary | ICD-10-CM | POA: Diagnosis not present

## 2024-09-08 NOTE — Progress Notes (Unsigned)
 EEG complete and ready for review.

## 2024-09-10 NOTE — Procedures (Signed)
 ELECTROENCEPHALOGRAM REPORT  Date of Study: 09/08/2024  Patient's Name: Debbie Peck  [place] MRN: [id number] Date of Birth: [birth year]  Referring Provider: Dr. Darice Shivers  Clinical History: This is a 63 year old woman with history of seizures since childhood with recurrent episodes of staring, inability to communicate  Medications: Lacosamide , Oxcarbazepine , Gabapentin   Technical Summary: A multichannel digital 1-hour EEG recording measured by the international 10-20 system with electrodes applied with paste and impedances below 5000 ohms performed as portable with EKG monitoring in an awake and asleep patient.  Hyperventilation and photic stimulation were performed.  The digital EEG was referentially recorded, reformatted, and digitally filtered in a variety of bipolar and referential montages for optimal display.   Description: The patient is awake and asleep during the recording.  During maximal wakefulness, there is a symmetric, medium voltage 9-10 Hz posterior dominant rhythm that attenuates with eye opening. This is admixed with occasional diffuse 4-5 Hz theta slowing of the waking background. There is additional independent occasional focal slowing seen over the bilateral temporal regions, left greater than right. During drowsiness and sleep, there is an increase in theta slowing of the background.  Vertex waves and symmetric sleep spindles were seen.  Hyperventilation and photic stimulation did not elicit any abnormalities.  There were no epileptiform discharges or electrographic seizures seen.    EKG lead was unremarkable.  Impression: This 1-hour awake and asleep EEG is abnormal due to the presence of: Mild diffuse slowing of the waking background Independent occasional focal slowing over the bilateral temporal temporal regions, left greater than right  Clinical Correlation of the above findings indicates diffuse cerebral dysfunction that is non-specific in etiology and can be seen  with hypoxic/ischemic injury, toxic/metabolic encephalopathies, neurodegenerative disorders, or medication effect. Focal slowing over the bilateral temporal regions indicates focal cerebral dysfunction in these regions suggestive of underlying structural or physiologic abnormality. The absence of epileptiform discharges does not rule out a clinical diagnosis of epilepsy.  Clinical correlation is advised.   Darice Shivers, M.D.

## 2024-09-10 NOTE — Telephone Encounter (Signed)
 Pt called an informed bloodwork is normal

## 2024-09-10 NOTE — Telephone Encounter (Signed)
-----   Message from Darice CHRISTELLA Shivers sent at 09/07/2024 10:41 PM EDT ----- Pls let her know the bloodwork is normal, thanks ----- Message ----- From: Interface, [facility] Results In Sent: 09/05/2024  11:41 PM EDT To: Darice CHRISTELLA Shivers, MD

## 2024-09-23 ENCOUNTER — Encounter: Payer: Self-pay | Admitting: Neurology

## 2024-09-24 ENCOUNTER — Encounter: Payer: Self-pay | Admitting: Neurology

## 2024-09-30 ENCOUNTER — Encounter: Payer: Self-pay | Admitting: Neurology

## 2024-10-01 ENCOUNTER — Telehealth: Payer: Self-pay

## 2024-10-01 NOTE — Telephone Encounter (Signed)
 MRI PA  PA number [id number] Auth dates 09/24/24 until 12/22/24

## 2024-10-05 ENCOUNTER — Ambulatory Visit
Admission: RE | Admit: 2024-10-05 | Discharge: 2024-10-05 | Disposition: A | Discharge status: Home / Self Care | Source: Ambulatory Visit | Attending: Neurology | Admitting: Neurology

## 2024-10-05 DIAGNOSIS — G40009 Localization-related (focal) (partial) idiopathic epilepsy and epileptic syndromes with seizures of localized onset, not intractable, without status epilepticus: Secondary | ICD-10-CM

## 2024-10-05 MED ORDER — GADOPICLENOL 0.5 MMOL/ML IV SOLN
9.0000 mL | Freq: Once | INTRAVENOUS | Status: AC | PRN
  Administered 2024-10-05: 9 mL via INTRAVENOUS

## 2024-10-06 NOTE — Progress Notes (Unsigned)
 SABRA

## 2024-10-07 ENCOUNTER — Encounter: Payer: Self-pay | Admitting: Neurology

## 2024-10-07 ENCOUNTER — Ambulatory Visit: Payer: Medicaid Other | Admitting: Neurology

## 2024-10-07 VITALS — BP 131/90 | HR 84 | Ht 63.0 in | Wt 205.6 lb

## 2024-10-07 DIAGNOSIS — G43009 Migraine without aura, not intractable, without status migrainosus: Secondary | ICD-10-CM | POA: Diagnosis not present

## 2024-10-07 DIAGNOSIS — G4733 Obstructive sleep apnea (adult) (pediatric): Secondary | ICD-10-CM

## 2024-10-07 MED ORDER — RIZATRIPTAN BENZOATE 10 MG PO TABS: ORAL_TABLET | ORAL | 5 refills | Status: AC

## 2024-10-07 NOTE — Progress Notes (Signed)
 Patient: Debbie Peck Date of Birth: 01/21/1961  Reason for Visit: Follow up History from: Patient, friend Primary Neurologist: Dr. Buck for sleep; Dr. Gregg for seizures  ASSESSMENT AND PLAN 63 y.o. year old female    Seizure Psychiatric disorder -Following with Dr. Georjean at [facility] neurology for seizures.  She is no longer taking Trileptal  due to hyponatremia, on Vimpat  100 mg twice a day, gabapentin  300 mg at bedtime.  MRI is pending, EEG showed slowing.  Referred to behavioral health psychiatry  3.  Migraine headache - Under good control, continue Maxalt  as needed  4.  OSA on CPAP - Recommend nightly usage minimum 4 hours, mask is leaking, try to tighten the mask, regularly replace supplies through DME.  Continue current settings.  AHI well treated at 1.7.  - CPAP set up July 2020  - HST Nov 2021 moderate OSA total AHI 19.4/hour.  Follow-up in 1 year or sooner if needed  HISTORY OF PRESENT ILLNESS: Today 10/07/24 10/07/24 SS: Here with friend, Dasie. She has now established with [facility] Neu[facility] Dr. Georjean. EEG showed diffuse slowing.  MRI brain is pending, referred to behavioral health psychiatry and psychotherapy.  Continue Vimpat  100 mg twice daily, gabapentin  300 mg at bedtime. She had an ER visit in August for seizure, her sodium level was 125 and switched from Trileptal  to Vimpat .  We will discuss CPAP today, 80% compliance, greater than 4 hours 70%, 6-12 cmH2O, AHI 1.7, leak 40. HST Nov 2021 moderate OSA total AHI 19.4/hour. CPAP setup July 2022. Uses [facility]. A few nights she falls asleep without putting it on. With CPAP more rested, energy, mood. Migraines are better, Maxalt  works well, no more than 1 a month.   10/04/23 SS: Here with friend, Dasie (house mate). Has been under a lot of stress. Her other personalities have been coming out (59 and 63 year old), nothing gets done. Financial stress, her husband lives with them [place]), he has prostate cancer. Her parents are  in assisted living, trying to work on their house. In ER for seizure 07/11/23, generalized seizure, bit her tongue- Dasie believes this was a severe panic attack, unclear if she missed any medication? She called about concern for losing insurance, is now on Medicaid. Labs when I saw her in July Trileptal  30, CBC and CMP were okay. Now that she has Medicaid, can get a veterinary surgeon. On Trileptal  600 +300 twice daily, gabapentin  300 mg at bedtime. Migraines are doing well, last was few months ago. CPAP data 09/04/23-10/03/23 29/30 days 97%, > 4 hours 80%, average usage days used 5 hours.6-12 cm.  Leak 29.2, AHI 1.0 FFM feeling well with CPAP  Update 06/07/23 SS: Saw Dr. Gregg in August 2023 Trileptal  level was low at 8, Trileptal  was increased 600 mg twice daily.  Saw Dr. Buck November 2023 for CPAP with excellent compliance.  Called in May 2024 reporting seizure, missed a dose of Trileptal .  Dr. Gregg increase Trileptal  900 mg twice daily. Here today with her friend, Dasie. 1 month ago had a huge panic attack regarding illness of her dog. At the time was not taking her medications regularly. Dasie has now taken over her medication. She has dissociative identity disorder reportedly. Migraines doing well. Uses Maxalt  maybe once a month. Takes gabapentin  300 mg at bedtime for migraine prevention and to help with sleep. Her parents are elderly, moving them into [place], has been some family conflict that has been stressful for her.   Update 07/10/22 Dr. Gregg: Debbie Peck is here today for follow-up.  Since last visit we have restarted her Trileptal  300 mg twice daily, denies any side effects from the medication. She is tolerating the medication very well.  She reports she might of had a mild event last couple weeks after starting the meds. Patient reports that she was in the bed and suddenly felt like she was about to get out of bed and felt like she was falling down back to bed.  She is not sure if she was just waking  up from sleep or if this was an actual seizure because she was by herself.  Denies any tongue biting, no other  concerning events.  INTERVAL HISTORY 04/06/22: Yesterday she was out with her dad at [facility], he was driving her home, she blacked out in the car. Felt as wave of nausea, her eyes closed, there was no description of shaking or tensing, it was a 5 minute car ride, remembers feeing sick, then was back at home and felt tired. Has bite marks to both sides on tongue. It is sore today. Last seizure was before 2014, was on carbamazepine  long acting 400 mg twice daily at that time, but she couldn't afford it so she stopped on her own. Under a lot of stress, caring for her parents in poor health. She is driving a car. In the past seizures have been generalized with loss of awareness. Remains on gabapentin  300 mg at bedtime for migraines, are doing well right now, has had more allergies and headaches lately, sometimes hard to differentiate. Hasn't had any Maxalt  lately. Has had seizures since a child. She is compliant with CPAP.  HISTORY  Copied 09/22/2021 Dr. Buck: Debbie Peck is a 63 year old right-handed woman with an underlying medical history of asthma, breast cancer, arthritis, anxiety, reflux disease, seizures, recurrent headaches and obesity with a BMI of over 40, who presents for follow-up consultation of her obstructive sleep apnea, after interim testing and starting AutoPap therapy.  The patient is unaccompanied today.  I first met her on 09/06/2021 at the request of Dr. Jenel and Debbie Born, NP, at which time she reported snoring and recurrent headaches including morning headaches as well as difficulty maintaining sleep.  She was advised to proceed with a sleep test, she had a home sleep test on 09/28/1999 2020 which indicated moderate obstructive sleep apnea with an AHI of 19.4/h, O2 nadir 83%.  She was advised to proceed with AutoPap therapy at home.  Her set up date was 06/15/2021.   Today,  09/22/2021: I reviewed her AutoPap compliance data from 09/18/2021 through 09/20/2021, during which time she used her machine every night with percent used days greater than 4 hour at 93%, indicating excellent compliance with an average usage of 6 hours and 19 minutes, residual AHI at goal at 2.1/h, leak on the high side with a 95th percentile at 33.7 L/min, 95th percentile of pressure at 10.7 cm with a range of 6 cm to 12 cm with.  She reports having adjusted well to treatment but does report difficulty with the mask as she is a side sleeper and she uses a full facemask.  She does have nasal congestion.  Sometimes it is seasonal with a flareup of her allergy symptoms so she opted for full facemask.  She is working on weight loss and tries to stay active by walking.  She has benefited from treatment of her sleep apnea, in that her sleep quality and sleep consolidation have improved.  She is  motivated to continue with treatment.  She has received a replacement set of supplies.  04/06/2021 SS: Debbie Peck is a 63 year old female with history of seizures and migraine headaches. Saw Dr. Buck in Oct 2021 for sleep consult, sleep study was ordered.  Showed moderate OSA. Hasn't started Autopap, still waiting for machine, sent to [facility]. Has headache today due to lack of caffeine, only 1 cup of coffee so far. In March, problems with left shoulder, doing PT right now. Had injections, suggested surgery. Remains on gabapentin  300 mg at bedtime. In general headaches are doing well, no significant headaches. Allergies are horrible, wears masks. No seizures. Some numbness right 3rd and 4th finger on tips. Driving, lives with husband and roommate.  Here today unaccompanied.  REVIEW OF SYSTEMS: Out of a complete 14 system review of symptoms, the patient complains only of the following symptoms, and all other reviewed systems are negative.  See HPI  ALLERGIES: Allergies  Allergen Reactions   Sulfa Drugs Cross Reactors      HOME MEDICATIONS: Outpatient Medications Prior to Visit  Medication Sig Dispense Refill   ADVAIR DISKUS 250-50 MCG/ACT AEPB Inhale 1 puff into the lungs 2 (two) times daily.     albuterol (VENTOLIN HFA) 108 (90 Base) MCG/ACT inhaler Inhale 1 puff into the lungs as needed.      azelastine (OPTIVAR) 0.05 % ophthalmic solution 1 drop 2 (two) times daily.     fluticasone (FLONASE) 50 MCG/ACT nasal spray Place 1 spray into both nostrils daily.     gabapentin  (NEURONTIN ) 300 MG capsule Take 1 capsule (300 mg total) by mouth at bedtime. 90 capsule 3   Lacosamide  100 MG TABS Take 1 tablet (100 mg total) by mouth every 12 (twelve) hours. 60 tablet 5   montelukast (SINGULAIR) 10 MG tablet SMARTSIG:1 Tablet(s) By Mouth Every Evening     rizatriptan  (MAXALT ) 10 MG tablet Take 1 tablet at the onset of migraine. May repeat in 2 hours if needed. Do not exceed 2 tablets in 24 hours. 10 tablet 5   UNKNOWN TO PATIENT Take 1 Dose by mouth 2 (two) times daily.     aspirin-acetaminophen-caffeine (EXCEDRIN MIGRAINE) 250-250-65 MG tablet Take 1 tablet by mouth every 6 (six) hours as needed for headache. (Patient not taking: Reported on 10/07/2024)     Oxcarbazepine  (TRILEPTAL ) 300 MG tablet Take 1 tablet (300 mg total) by mouth 2 (two) times daily. To take with an additional 600 mg for a total of 900 mg twice daily (Patient not taking: Reported on 09/05/2024) 180 tablet 3   oxcarbazepine  (TRILEPTAL ) 600 MG tablet Take 1 tablet (600 mg total) by mouth 2 (two) times daily. To take with a addition of 300 for a total of 900 mg twice daily (Patient not taking: Reported on 09/05/2024) 180 tablet 2   No facility-administered medications prior to visit.    PAST MEDICAL HISTORY: Past Medical History:  Diagnosis Date   Asthma    Breast cancer (HCC)    Cerebral palsy (HCC)    Degenerative arthritis    Depression with anxiety    Gastroesophageal reflux disease    Localization-related (focal) (partial) epilepsy and  epileptic syndromes with complex partial seizures, without mention of intractable epilepsy    Migraine without aura, without mention of intractable migraine without mention of status migrainosus    Obesity    Personal history of chemotherapy    Personal history of radiation therapy    Seizure (HCC)    Sleep apnea  father    PAST SURGICAL HISTORY: Past Surgical History:  Procedure Laterality Date   BREAST LUMPECTOMY Right 1998   BREAST LUMPECTOMY Left 2008   FOOT CAPSULE RELEASE W/ PERCUTANEOUS HEEL CORD LENGTHENING, TIBIAL TENDON TRANSFER Right    HERNIA REPAIR     OOPHORECTOMY Bilateral    ovaries remove      FAMILY HISTORY: Family History  Problem Relation Age of Onset   Seizures Mother    Stroke Mother    Hypertension Father    Diabetes Father    Sleep apnea Father    Obesity Sister    Sleep apnea Nephew     SOCIAL HISTORY: Social History   Socioeconomic History   Marital status: Married    Spouse name: Elspeth    Number of children: 0   Years of education: HS   Highest education level: Not on file  Occupational History   Occupation: Disabled   Tobacco Use   Smoking status: Never   Smokeless tobacco: Never  Vaping Use   Vaping status: Never Used  Substance and Sexual Activity   Alcohol use: Yes    Comment: OCC   Drug use: No   Sexual activity: Yes  Other Topics Concern   Not on file  Social History Narrative   Patient is married to [place] and lives with him and roommate.    Patient has no children.    Patient is on disability.    Patient is left handed.    Patient has some community college.    Social Drivers of Corporate Investment Banker Strain: Not on file  Food Insecurity: Not on file  Transportation Needs: Not on file  Physical Activity: Not on file  Stress: Not on file  Social Connections: Not on file  Intimate Partner Violence: Not on file   PHYSICAL EXAM  Vitals:   10/07/24 1331  BP: (!) 131/90  Pulse: 84  SpO2: 98%  Weight:  205 lb 9.6 oz (93.3 kg)  Height: 5' 3 (1.6 m)   Body mass index is 36.42 kg/m.  Generalized: Well developed, in no acute distress  Neurological examination  Mentation: Alert oriented to time, place, history taking. Follows all commands speech and language fluent.   Cranial nerve II-XII: Pupils were equal round reactive to light. Extraocular movements were full, visual field were full on confrontational test. Facial sensation and strength were normal. Head turning and shoulder shrug  were normal and symmetric.  Motor: The motor testing reveals 5 over 5 strength of all 4 extremities. Good symmetric motor tone is noted throughout.  Gait and station: Gait is normal.   DIAGNOSTIC DATA (LABS, IMAGING, TESTING) - I reviewed patient records, labs, notes, testing and imaging myself where available.  Lab Results  Component Value Date   WBC 10.1 09/05/2024   HGB 14.2 09/05/2024   HCT 42.1 09/05/2024   MCV 91.1 09/05/2024   PLT 334 09/05/2024      Component Value Date/Time   NA 141 09/05/2024 1218   NA 132 (L) 10/04/2023 1345   K 3.9 09/05/2024 1218   CL 105 09/05/2024 1218   CO2 26 09/05/2024 1218   GLUCOSE 86 09/05/2024 1218   BUN 11 09/05/2024 1218   BUN 6 (L) 10/04/2023 1345   CREATININE 0.63 09/05/2024 1218   CALCIUM 9.4 09/05/2024 1218   PROT 6.9 09/05/2024 1218   PROT 6.5 10/04/2023 1345   ALBUMIN 3.6 06/30/2024 2040   ALBUMIN 4.3 10/04/2023 1345   AST 13 09/05/2024  1218   ALT 13 09/05/2024 1218   ALKPHOS 63 06/30/2024 2040   BILITOT 0.3 09/05/2024 1218   BILITOT <0.2 10/04/2023 1345   GFRNONAA >60 06/30/2024 2040   GFRAA  04/24/2007 0934    >60        The eGFR has been calculated using the MDRD equation. This calculation has not been validated in all clinical   No results found for: CHOL, HDL, LDLCALC, LDLDIRECT, TRIG, CHOLHDL No results found for: YHAJ8R No results found for: VITAMINB12 No results found for: TSH  Debbie Gayland MANDES, DNP   [facility] [address] [place], [place] [address] [phone]

## 2024-10-07 NOTE — Patient Instructions (Signed)
 Continue with Dr. Georjean for seizure care Continue nightly CPAP use, recommend nightly use minimum 4 hours, mask is leaking, try to tighten mask Follow up in 1 year

## 2024-10-15 NOTE — Telephone Encounter (Signed)
 Pt called an informed that brain MRI did not show any tumor, stroke, or bleed. It showed some hardening of the small blood vessels seen in patient with BP, cholesterol, glucose control issues. Her EEG did not show any seizure activity. F/u as scheduled in Jan, pt was given date and time of the appointment

## 2024-10-15 NOTE — Telephone Encounter (Signed)
-----   Message from Darice CHRISTELLA Shivers sent at 10/10/2024  1:23 AM EST ----- Pls let her know the brain MRI did not show any tumor, stroke, or bleed. It showed some hardening of the small blood vessels seen in patient with BP, cholesterol, glucose control issues. Her EEG did  not show any seizure activity. F/u as scheduled in Jan ----- Message ----- From: Rebecka, Rad Results In Sent: 10/09/2024   3:59 PM EST To: Darice CHRISTELLA Shivers, MD

## 2024-12-15 ENCOUNTER — Ambulatory Visit: Payer: Self-pay | Admitting: Neurology

## 2025-10-08 ENCOUNTER — Ambulatory Visit: Admitting: Neurology
